# Patient Record
Sex: Male | Born: 1995 | Race: White | Hispanic: No | Marital: Married | Smoking: Never smoker
Health system: Southern US, Community
[De-identification: ages and names within clinical notes are randomized; demographics above are authoritative.]

---

## 2020-02-09 ENCOUNTER — Inpatient Hospital Stay (HOSPITAL_COMMUNITY): Payer: No Typology Code available for payment source

## 2020-02-09 ENCOUNTER — Emergency Department (HOSPITAL_COMMUNITY): Payer: No Typology Code available for payment source | Admitting: Certified Registered Nurse Anesthetist

## 2020-02-09 ENCOUNTER — Encounter (HOSPITAL_COMMUNITY): Payer: Self-pay | Admitting: Certified Registered Nurse Anesthetist

## 2020-02-09 ENCOUNTER — Inpatient Hospital Stay (HOSPITAL_COMMUNITY)
Admission: EM | Admit: 2020-02-09 | Discharge: 2020-02-21 | DRG: 464 | Disposition: A | Discharge status: Home / Self Care | Payer: No Typology Code available for payment source | Attending: Orthopedic Surgery | Admitting: Orthopedic Surgery

## 2020-02-09 ENCOUNTER — Emergency Department (HOSPITAL_COMMUNITY): Payer: No Typology Code available for payment source

## 2020-02-09 ENCOUNTER — Other Ambulatory Visit: Payer: Self-pay

## 2020-02-09 ENCOUNTER — Encounter (HOSPITAL_COMMUNITY)
Admission: EM | Disposition: A | Discharge status: Home / Self Care | Payer: Self-pay | Source: Home / Self Care | Attending: Orthopedic Surgery

## 2020-02-09 DIAGNOSIS — S91311A Laceration without foreign body, right foot, initial encounter: Secondary | ICD-10-CM | POA: Diagnosis present

## 2020-02-09 DIAGNOSIS — Z20822 Contact with and (suspected) exposure to covid-19: Secondary | ICD-10-CM | POA: Diagnosis present

## 2020-02-09 DIAGNOSIS — S82221C Displaced transverse fracture of shaft of right tibia, initial encounter for open fracture type IIIA, IIIB, or IIIC: Secondary | ICD-10-CM | POA: Diagnosis present

## 2020-02-09 DIAGNOSIS — R58 Hemorrhage, not elsewhere classified: Secondary | ICD-10-CM

## 2020-02-09 DIAGNOSIS — H53149 Visual discomfort, unspecified: Secondary | ICD-10-CM | POA: Diagnosis not present

## 2020-02-09 DIAGNOSIS — D62 Acute posthemorrhagic anemia: Secondary | ICD-10-CM | POA: Diagnosis present

## 2020-02-09 DIAGNOSIS — S82491C Other fracture of shaft of right fibula, initial encounter for open fracture type IIIA, IIIB, or IIIC: Secondary | ICD-10-CM | POA: Diagnosis present

## 2020-02-09 DIAGNOSIS — S92912A Unspecified fracture of left toe(s), initial encounter for closed fracture: Secondary | ICD-10-CM

## 2020-02-09 DIAGNOSIS — S80211A Abrasion, right knee, initial encounter: Secondary | ICD-10-CM | POA: Diagnosis present

## 2020-02-09 DIAGNOSIS — R52 Pain, unspecified: Secondary | ICD-10-CM

## 2020-02-09 DIAGNOSIS — E8889 Other specified metabolic disorders: Secondary | ICD-10-CM | POA: Diagnosis present

## 2020-02-09 DIAGNOSIS — H538 Other visual disturbances: Secondary | ICD-10-CM | POA: Diagnosis not present

## 2020-02-09 DIAGNOSIS — S82202B Unspecified fracture of shaft of left tibia, initial encounter for open fracture type I or II: Secondary | ICD-10-CM

## 2020-02-09 DIAGNOSIS — S92515A Nondisplaced fracture of proximal phalanx of left lesser toe(s), initial encounter for closed fracture: Secondary | ICD-10-CM | POA: Diagnosis present

## 2020-02-09 DIAGNOSIS — S91009A Unspecified open wound, unspecified ankle, initial encounter: Secondary | ICD-10-CM

## 2020-02-09 DIAGNOSIS — S0003XA Contusion of scalp, initial encounter: Secondary | ICD-10-CM | POA: Diagnosis present

## 2020-02-09 DIAGNOSIS — Y92410 Unspecified street and highway as the place of occurrence of the external cause: Secondary | ICD-10-CM | POA: Diagnosis not present

## 2020-02-09 DIAGNOSIS — S82402B Unspecified fracture of shaft of left fibula, initial encounter for open fracture type I or II: Secondary | ICD-10-CM

## 2020-02-09 DIAGNOSIS — T148XXA Other injury of unspecified body region, initial encounter: Secondary | ICD-10-CM

## 2020-02-09 DIAGNOSIS — S82209B Unspecified fracture of shaft of unspecified tibia, initial encounter for open fracture type I or II: Secondary | ICD-10-CM | POA: Diagnosis present

## 2020-02-09 DIAGNOSIS — Z419 Encounter for procedure for purposes other than remedying health state, unspecified: Secondary | ICD-10-CM

## 2020-02-09 DIAGNOSIS — S060X0A Concussion without loss of consciousness, initial encounter: Secondary | ICD-10-CM | POA: Diagnosis present

## 2020-02-09 DIAGNOSIS — S134XXA Sprain of ligaments of cervical spine, initial encounter: Secondary | ICD-10-CM | POA: Diagnosis present

## 2020-02-09 HISTORY — PX: I & D EXTREMITY: SHX5045

## 2020-02-09 HISTORY — DX: Displaced transverse fracture of shaft of right tibia, initial encounter for open fracture type IIIA, IIIB, or IIIC: S82.221C

## 2020-02-09 HISTORY — PX: EXTERNAL FIXATION LEG: SHX1549

## 2020-02-09 LAB — COMPREHENSIVE METABOLIC PANEL
ALT: 27 U/L (ref 0–44)
AST: 34 U/L (ref 15–41)
Albumin: 4.3 g/dL (ref 3.5–5.0)
Alkaline Phosphatase: 36 U/L — ABNORMAL LOW (ref 38–126)
Anion gap: 11 (ref 5–15)
BUN: 18 mg/dL (ref 6–20)
CO2: 24 mmol/L (ref 22–32)
Calcium: 9.1 mg/dL (ref 8.9–10.3)
Chloride: 104 mmol/L (ref 98–111)
Creatinine, Ser: 1.25 mg/dL — ABNORMAL HIGH (ref 0.61–1.24)
GFR calc Af Amer: >60 mL/min (ref >60)
GFR calc non Af Amer: >60 mL/min (ref >60)
Glucose, Bld: 125 mg/dL — ABNORMAL HIGH (ref 70–99)
Potassium: 3.6 mmol/L (ref 3.5–5.1)
Sodium: 139 mmol/L (ref 135–145)
Total Bilirubin: 0.8 mg/dL (ref 0.3–1.2)
Total Protein: 6.6 g/dL (ref 6.5–8.1)

## 2020-02-09 LAB — CBC
HCT: 42.2 % (ref 39.0–52.0)
Hemoglobin: 14.2 g/dL (ref 13.0–17.0)
MCH: 29.3 pg (ref 26.0–34.0)
MCHC: 33.6 g/dL (ref 30.0–36.0)
MCV: 87.2 fL (ref 80.0–100.0)
Platelets: 328 10*3/uL (ref 150–400)
RBC: 4.84 MIL/uL (ref 4.22–5.81)
RDW: 11.5 % (ref 11.5–15.5)
WBC: 16.3 10*3/uL — ABNORMAL HIGH (ref 4.0–10.5)
nRBC: 0 % (ref 0.0–0.2)

## 2020-02-09 LAB — I-STAT CHEM 8, ED
BUN: 19 mg/dL (ref 6–20)
Calcium, Ion: 1.14 mmol/L — ABNORMAL LOW (ref 1.15–1.40)
Chloride: 103 mmol/L (ref 98–111)
Creatinine, Ser: 1.3 mg/dL — ABNORMAL HIGH (ref 0.61–1.24)
Glucose, Bld: 120 mg/dL — ABNORMAL HIGH (ref 70–99)
HCT: 42 % (ref 39.0–52.0)
Hemoglobin: 14.3 g/dL (ref 13.0–17.0)
Potassium: 3.5 mmol/L (ref 3.5–5.1)
Sodium: 138 mmol/L (ref 135–145)
TCO2: 26 mmol/L (ref 22–32)

## 2020-02-09 LAB — HIV ANTIBODY (ROUTINE TESTING W REFLEX): HIV Screen 4th Generation wRfx: NONREACTIVE

## 2020-02-09 LAB — LACTIC ACID, PLASMA: Lactic Acid, Venous: 2.3 mmol/L (ref 0.5–1.9)

## 2020-02-09 LAB — PROTIME-INR
INR: 1.1 (ref 0.8–1.2)
Prothrombin Time: 14.1 seconds (ref 11.4–15.2)

## 2020-02-09 LAB — ETHANOL: Alcohol, Ethyl (B): <10 mg/dL (ref <10)

## 2020-02-09 LAB — RESPIRATORY PANEL BY RT PCR (FLU A&B, COVID)
Influenza A by PCR: NEGATIVE
Influenza B by PCR: NEGATIVE
SARS Coronavirus 2 by RT PCR: NEGATIVE

## 2020-02-09 LAB — SAMPLE TO BLOOD BANK

## 2020-02-09 SURGERY — EXTERNAL FIXATION, LOWER EXTREMITY
Anesthesia: General | Laterality: Right

## 2020-02-09 MED ORDER — DEXMEDETOMIDINE HCL 200 MCG/2ML IV SOLN
INTRAVENOUS | Status: DC | PRN
  Administered 2020-02-09 (×2): 8 ug via INTRAVENOUS
  Administered 2020-02-09: 12 ug via INTRAVENOUS

## 2020-02-09 MED ORDER — VANCOMYCIN HCL 1000 MG IV SOLR
INTRAVENOUS | Status: AC
  Filled 2020-02-09: qty 1000

## 2020-02-09 MED ORDER — OXYCODONE HCL 5 MG PO TABS
5.0000 mg | ORAL_TABLET | ORAL | Status: DC | PRN
  Administered 2020-02-12 (×3): 10 mg via ORAL
  Filled 2020-02-09 (×5): qty 2

## 2020-02-09 MED ORDER — LIDOCAINE 2% (20 MG/ML) 5 ML SYRINGE
INTRAMUSCULAR | Status: DC | PRN
  Administered 2020-02-09: 60 mg via INTRAVENOUS

## 2020-02-09 MED ORDER — MIDAZOLAM HCL 2 MG/2ML IJ SOLN
INTRAMUSCULAR | Status: AC
  Filled 2020-02-09: qty 2

## 2020-02-09 MED ORDER — ONDANSETRON HCL 4 MG/2ML IJ SOLN
INTRAMUSCULAR | Status: AC
  Filled 2020-02-09: qty 2

## 2020-02-09 MED ORDER — ONDANSETRON HCL 4 MG PO TABS: 4.0000 mg | ORAL_TABLET | Freq: Four times a day (QID) | ORAL | Status: DC | PRN

## 2020-02-09 MED ORDER — TOBRAMYCIN SULFATE 1.2 G IJ SOLR
INTRAMUSCULAR | Status: DC | PRN
  Administered 2020-02-09: 1.2 g

## 2020-02-09 MED ORDER — FENTANYL CITRATE (PF) 100 MCG/2ML IJ SOLN
INTRAMUSCULAR | Status: AC
  Filled 2020-02-09: qty 2

## 2020-02-09 MED ORDER — DOCUSATE SODIUM 100 MG PO CAPS
100.0000 mg | ORAL_CAPSULE | Freq: Two times a day (BID) | ORAL | Status: DC
  Administered 2020-02-09 – 2020-02-21 (×23): 100 mg via ORAL
  Filled 2020-02-09 (×23): qty 1

## 2020-02-09 MED ORDER — TOBRAMYCIN SULFATE 1.2 G IJ SOLR
INTRAMUSCULAR | Status: AC
  Filled 2020-02-09: qty 1.2

## 2020-02-09 MED ORDER — SODIUM CHLORIDE 0.9 % IV SOLN
2.0000 g | INTRAVENOUS | Status: AC
  Administered 2020-02-09 – 2020-02-11 (×3): 2 g via INTRAVENOUS
  Filled 2020-02-09 (×3): qty 20

## 2020-02-09 MED ORDER — MORPHINE SULFATE (PF) 2 MG/ML IV SOLN
2.0000 mg | INTRAVENOUS | Status: DC | PRN
  Administered 2020-02-10 – 2020-02-15 (×3): 2 mg via INTRAVENOUS
  Filled 2020-02-09 (×3): qty 1

## 2020-02-09 MED ORDER — DEXAMETHASONE SODIUM PHOSPHATE 10 MG/ML IJ SOLN
INTRAMUSCULAR | Status: DC | PRN
  Administered 2020-02-09: 4 mg via INTRAVENOUS

## 2020-02-09 MED ORDER — LIDOCAINE 2% (20 MG/ML) 5 ML SYRINGE
INTRAMUSCULAR | Status: AC
  Filled 2020-02-09: qty 5

## 2020-02-09 MED ORDER — ACETAMINOPHEN 325 MG PO TABS: 325.0000 mg | ORAL_TABLET | Freq: Four times a day (QID) | ORAL | Status: DC | PRN

## 2020-02-09 MED ORDER — OXYCODONE HCL 5 MG PO TABS
10.0000 mg | ORAL_TABLET | ORAL | Status: DC | PRN
  Administered 2020-02-10 – 2020-02-11 (×6): 15 mg via ORAL
  Administered 2020-02-12: 10 mg via ORAL
  Administered 2020-02-13 (×2): 15 mg via ORAL
  Administered 2020-02-13: 10 mg via ORAL
  Administered 2020-02-14 (×3): 15 mg via ORAL
  Filled 2020-02-09 (×2): qty 2
  Filled 2020-02-09 (×11): qty 3

## 2020-02-09 MED ORDER — ENOXAPARIN SODIUM 40 MG/0.4ML ~~LOC~~ SOLN
40.0000 mg | SUBCUTANEOUS | Status: DC
  Administered 2020-02-10 – 2020-02-21 (×10): 40 mg via SUBCUTANEOUS
  Filled 2020-02-09 (×11): qty 0.4

## 2020-02-09 MED ORDER — CEFAZOLIN SODIUM-DEXTROSE 1-4 GM/50ML-% IV SOLN
INTRAVENOUS | Status: DC | PRN
  Administered 2020-02-09: 1 g via INTRAVENOUS

## 2020-02-09 MED ORDER — POTASSIUM CHLORIDE IN NACL 20-0.9 MEQ/L-% IV SOLN
INTRAVENOUS | Status: DC
  Administered 2020-02-18: 100 mL/h via INTRAVENOUS
  Filled 2020-02-09 (×11): qty 1000

## 2020-02-09 MED ORDER — ACETAMINOPHEN 160 MG/5ML PO SOLN: 325.0000 mg | Freq: Once | ORAL | Status: DC | PRN

## 2020-02-09 MED ORDER — SODIUM CHLORIDE 0.9 % IV SOLN: INTRAVENOUS | Status: DC | PRN

## 2020-02-09 MED ORDER — ALBUMIN HUMAN 5 % IV SOLN: INTRAVENOUS | Status: DC | PRN

## 2020-02-09 MED ORDER — MEPERIDINE HCL 25 MG/ML IJ SOLN: 6.2500 mg | INTRAMUSCULAR | Status: DC | PRN

## 2020-02-09 MED ORDER — METOCLOPRAMIDE HCL 5 MG/ML IJ SOLN: 5.0000 mg | Freq: Three times a day (TID) | INTRAMUSCULAR | Status: DC | PRN

## 2020-02-09 MED ORDER — METHOCARBAMOL 1000 MG/10ML IJ SOLN
500.0000 mg | Freq: Four times a day (QID) | INTRAVENOUS | Status: DC | PRN
  Filled 2020-02-09: qty 5

## 2020-02-09 MED ORDER — MIDAZOLAM HCL 5 MG/5ML IJ SOLN
INTRAMUSCULAR | Status: DC | PRN
  Administered 2020-02-09: 2 mg via INTRAVENOUS

## 2020-02-09 MED ORDER — FENTANYL CITRATE (PF) 100 MCG/2ML IJ SOLN
50.0000 ug | Freq: Once | INTRAMUSCULAR | Status: AC
  Administered 2020-02-09: 15:00:00 50 ug via INTRAVENOUS

## 2020-02-09 MED ORDER — SODIUM CHLORIDE (PF) 0.9 % IJ SOLN
INTRAMUSCULAR | Status: AC
  Filled 2020-02-09: qty 10

## 2020-02-09 MED ORDER — ROCURONIUM BROMIDE 10 MG/ML (PF) SYRINGE
PREFILLED_SYRINGE | INTRAVENOUS | Status: DC | PRN
  Administered 2020-02-09 (×2): 20 mg via INTRAVENOUS
  Administered 2020-02-09: 60 mg via INTRAVENOUS

## 2020-02-09 MED ORDER — SUCCINYLCHOLINE CHLORIDE 200 MG/10ML IV SOSY
PREFILLED_SYRINGE | INTRAVENOUS | Status: DC | PRN
  Administered 2020-02-09: 180 mg via INTRAVENOUS

## 2020-02-09 MED ORDER — HYDROMORPHONE HCL 1 MG/ML IJ SOLN
INTRAMUSCULAR | Status: AC
  Filled 2020-02-09: qty 1

## 2020-02-09 MED ORDER — PHENYLEPHRINE 40 MCG/ML (10ML) SYRINGE FOR IV PUSH (FOR BLOOD PRESSURE SUPPORT)
PREFILLED_SYRINGE | INTRAVENOUS | Status: DC | PRN
  Administered 2020-02-09 (×3): 120 ug via INTRAVENOUS

## 2020-02-09 MED ORDER — PHENYLEPHRINE 40 MCG/ML (10ML) SYRINGE FOR IV PUSH (FOR BLOOD PRESSURE SUPPORT)
PREFILLED_SYRINGE | INTRAVENOUS | Status: AC
  Filled 2020-02-09: qty 30

## 2020-02-09 MED ORDER — METHOCARBAMOL 1000 MG/10ML IJ SOLN
500.0000 mg | Freq: Four times a day (QID) | INTRAVENOUS | Status: DC | PRN
  Filled 2020-02-09 (×4): qty 5

## 2020-02-09 MED ORDER — DEXAMETHASONE SODIUM PHOSPHATE 10 MG/ML IJ SOLN
INTRAMUSCULAR | Status: AC
  Filled 2020-02-09: qty 1

## 2020-02-09 MED ORDER — VANCOMYCIN HCL 1000 MG IV SOLR
INTRAVENOUS | Status: DC | PRN
  Administered 2020-02-09: 1000 mg

## 2020-02-09 MED ORDER — CEFAZOLIN SODIUM 1 G IJ SOLR
INTRAMUSCULAR | Status: AC
  Filled 2020-02-09: qty 10

## 2020-02-09 MED ORDER — LACTATED RINGERS IV SOLN: INTRAVENOUS | Status: DC

## 2020-02-09 MED ORDER — 0.9 % SODIUM CHLORIDE (POUR BTL) OPTIME
TOPICAL | Status: DC | PRN
  Administered 2020-02-09: 1000 mL

## 2020-02-09 MED ORDER — METHOCARBAMOL 500 MG PO TABS
ORAL_TABLET | ORAL | Status: AC
  Filled 2020-02-09: qty 1

## 2020-02-09 MED ORDER — FENTANYL CITRATE (PF) 100 MCG/2ML IJ SOLN: 50.0000 ug | INTRAMUSCULAR | Status: DC | PRN

## 2020-02-09 MED ORDER — FENTANYL CITRATE (PF) 250 MCG/5ML IJ SOLN
INTRAMUSCULAR | Status: DC | PRN
  Administered 2020-02-09: 50 ug via INTRAVENOUS
  Administered 2020-02-09: 100 ug via INTRAVENOUS
  Administered 2020-02-09 (×2): 50 ug via INTRAVENOUS
  Administered 2020-02-09: 100 ug via INTRAVENOUS
  Administered 2020-02-09: 50 ug via INTRAVENOUS

## 2020-02-09 MED ORDER — METOCLOPRAMIDE HCL 5 MG PO TABS: 5.0000 mg | ORAL_TABLET | Freq: Three times a day (TID) | ORAL | Status: DC | PRN

## 2020-02-09 MED ORDER — ROCURONIUM BROMIDE 10 MG/ML (PF) SYRINGE
PREFILLED_SYRINGE | INTRAVENOUS | Status: AC
  Filled 2020-02-09: qty 30

## 2020-02-09 MED ORDER — ROCURONIUM BROMIDE 10 MG/ML (PF) SYRINGE
PREFILLED_SYRINGE | INTRAVENOUS | Status: AC
  Filled 2020-02-09: qty 10

## 2020-02-09 MED ORDER — OXYCODONE HCL 5 MG PO TABS: 5.0000 mg | ORAL_TABLET | ORAL | Status: DC | PRN

## 2020-02-09 MED ORDER — METHOCARBAMOL 500 MG PO TABS
500.0000 mg | ORAL_TABLET | Freq: Four times a day (QID) | ORAL | Status: DC | PRN
  Administered 2020-02-09 – 2020-02-13 (×6): 500 mg via ORAL
  Filled 2020-02-09 (×5): qty 1

## 2020-02-09 MED ORDER — SUCCINYLCHOLINE CHLORIDE 200 MG/10ML IV SOSY
PREFILLED_SYRINGE | INTRAVENOUS | Status: AC
  Filled 2020-02-09: qty 10

## 2020-02-09 MED ORDER — PROMETHAZINE HCL 25 MG/ML IJ SOLN: 6.2500 mg | INTRAMUSCULAR | Status: DC | PRN

## 2020-02-09 MED ORDER — IOHEXOL 300 MG/ML  SOLN
100.0000 mL | Freq: Once | INTRAMUSCULAR | Status: AC | PRN
  Administered 2020-02-09: 100 mL via INTRAVENOUS

## 2020-02-09 MED ORDER — HYDROMORPHONE HCL 1 MG/ML IJ SOLN
1.0000 mg | INTRAMUSCULAR | Status: DC | PRN
  Administered 2020-02-10 – 2020-02-14 (×7): 1 mg via INTRAVENOUS
  Filled 2020-02-09 (×8): qty 1

## 2020-02-09 MED ORDER — ACETAMINOPHEN 500 MG PO TABS
1000.0000 mg | ORAL_TABLET | Freq: Four times a day (QID) | ORAL | Status: DC
  Administered 2020-02-10 – 2020-02-14 (×16): 1000 mg via ORAL
  Filled 2020-02-09 (×18): qty 2

## 2020-02-09 MED ORDER — METHOCARBAMOL 500 MG PO TABS: 500.0000 mg | ORAL_TABLET | Freq: Four times a day (QID) | ORAL | Status: DC | PRN

## 2020-02-09 MED ORDER — GABAPENTIN 100 MG PO CAPS
100.0000 mg | ORAL_CAPSULE | Freq: Three times a day (TID) | ORAL | Status: DC
  Administered 2020-02-09 – 2020-02-15 (×16): 100 mg via ORAL
  Filled 2020-02-09 (×16): qty 1

## 2020-02-09 MED ORDER — ACETAMINOPHEN 10 MG/ML IV SOLN: 1000.0000 mg | Freq: Once | INTRAVENOUS | Status: DC | PRN

## 2020-02-09 MED ORDER — PROPOFOL 10 MG/ML IV BOLUS
INTRAVENOUS | Status: DC | PRN
  Administered 2020-02-09: 180 mg via INTRAVENOUS

## 2020-02-09 MED ORDER — FENTANYL CITRATE (PF) 250 MCG/5ML IJ SOLN
INTRAMUSCULAR | Status: AC
  Filled 2020-02-09: qty 5

## 2020-02-09 MED ORDER — TETANUS-DIPHTH-ACELL PERTUSSIS 5-2.5-18.5 LF-MCG/0.5 IM SUSP
0.5000 mL | Freq: Once | INTRAMUSCULAR | Status: AC
  Administered 2020-02-09: 14:00:00 0.5 mL via INTRAMUSCULAR

## 2020-02-09 MED ORDER — HYDROMORPHONE HCL 1 MG/ML IJ SOLN
0.2500 mg | INTRAMUSCULAR | Status: DC | PRN
  Administered 2020-02-09 (×3): 0.5 mg via INTRAVENOUS

## 2020-02-09 MED ORDER — PROPOFOL 10 MG/ML IV BOLUS
INTRAVENOUS | Status: AC
  Filled 2020-02-09: qty 20

## 2020-02-09 MED ORDER — EPHEDRINE 5 MG/ML INJ
INTRAVENOUS | Status: AC
  Filled 2020-02-09: qty 10

## 2020-02-09 MED ORDER — POLYETHYLENE GLYCOL 3350 17 G PO PACK
17.0000 g | PACK | Freq: Every day | ORAL | Status: DC | PRN
  Administered 2020-02-12 – 2020-02-15 (×2): 17 g via ORAL
  Filled 2020-02-09 (×2): qty 1

## 2020-02-09 MED ORDER — ONDANSETRON HCL 4 MG/2ML IJ SOLN: 4.0000 mg | Freq: Four times a day (QID) | INTRAMUSCULAR | Status: DC | PRN

## 2020-02-09 MED ORDER — LACTATED RINGERS IV SOLN: INTRAVENOUS | Status: DC | PRN

## 2020-02-09 MED ORDER — ONDANSETRON HCL 4 MG/2ML IJ SOLN
4.0000 mg | Freq: Four times a day (QID) | INTRAMUSCULAR | Status: DC | PRN
  Administered 2020-02-10 – 2020-02-17 (×4): 4 mg via INTRAVENOUS
  Filled 2020-02-09 (×4): qty 2

## 2020-02-09 MED ORDER — ACETAMINOPHEN 325 MG PO TABS: 325.0000 mg | ORAL_TABLET | Freq: Once | ORAL | Status: DC | PRN

## 2020-02-09 MED ORDER — ONDANSETRON HCL 4 MG/2ML IJ SOLN
INTRAMUSCULAR | Status: DC | PRN
  Administered 2020-02-09: 4 mg via INTRAVENOUS

## 2020-02-09 MED ORDER — SUGAMMADEX SODIUM 200 MG/2ML IV SOLN
INTRAVENOUS | Status: DC | PRN
  Administered 2020-02-09: 50 mg via INTRAVENOUS
  Administered 2020-02-09 (×2): 100 mg via INTRAVENOUS

## 2020-02-09 SURGICAL SUPPLY — 58 items
BAR GLASS FIBER EXFX 11X150 (EXFIX) ×3 IMPLANT
BAR GLASS FIBER EXFX 11X200 (EXFIX) ×3 IMPLANT
BAR GLASS FIBER EXFX 11X400 (EXFIX) ×3 IMPLANT
BAR GLASS FIBER EXFX 11X500 (EXFIX) ×3 IMPLANT
BIT DRILL 3.2 XTRAFIX BLUE (BIT) ×3 IMPLANT
BIT DRILL CANN MED FLUTE 4.0 (BIT) ×1 IMPLANT
BNDG ELASTIC 4X5.8 VLCR STR LF (GAUZE/BANDAGES/DRESSINGS) ×6 IMPLANT
BNDG ELASTIC 6X15 VLCR STRL LF (GAUZE/BANDAGES/DRESSINGS) ×3 IMPLANT
BNDG ELASTIC 6X5.8 VLCR STR LF (GAUZE/BANDAGES/DRESSINGS) ×3 IMPLANT
BRUSH SCRUB EZ PLAIN DRY (MISCELLANEOUS) ×6 IMPLANT
CHLORAPREP W/TINT 26 (MISCELLANEOUS) ×6 IMPLANT
CLAMP BLUE BAR TO BAR (EXFIX) ×6 IMPLANT
CLAMP BLUE BAR TO PIN (EXFIX) ×12 IMPLANT
CONNECTOR Y ATS VAC SYSTEM (MISCELLANEOUS) ×3 IMPLANT
COVER MAYO STAND STRL (DRAPES) ×3 IMPLANT
COVER PROBE W GEL 5X96 (DRAPES) ×3 IMPLANT
COVER SURGICAL LIGHT HANDLE (MISCELLANEOUS) ×3 IMPLANT
DRAPE C-ARM 42X72 X-RAY (DRAPES) ×3 IMPLANT
DRAPE C-ARMOR (DRAPES) ×3 IMPLANT
DRAPE IMP U-DRAPE 54X76 (DRAPES) ×3 IMPLANT
DRAPE ORTHO SPLIT 77X108 STRL (DRAPES) ×4
DRAPE SURG ORHT 6 SPLT 77X108 (DRAPES) ×2 IMPLANT
DRAPE U-SHAPE 47X51 STRL (DRAPES) ×3 IMPLANT
DRILL CANN 4.0MM (BIT) ×3
DRSG MEPITEL 4X7.2 (GAUZE/BANDAGES/DRESSINGS) ×6 IMPLANT
ELECT REM PT RETURN 9FT ADLT (ELECTROSURGICAL) ×3
ELECTRODE REM PT RTRN 9FT ADLT (ELECTROSURGICAL) ×1 IMPLANT
GAUZE SPONGE 4X4 12PLY STRL (GAUZE/BANDAGES/DRESSINGS) ×3 IMPLANT
GLOVE BIO SURGEON STRL SZ 6.5 (GLOVE) ×6 IMPLANT
GLOVE BIO SURGEON STRL SZ7.5 (GLOVE) ×12 IMPLANT
GLOVE BIO SURGEONS STRL SZ 6.5 (GLOVE) ×3
GLOVE BIOGEL PI IND STRL 6.5 (GLOVE) ×3 IMPLANT
GLOVE BIOGEL PI IND STRL 7.5 (GLOVE) ×3 IMPLANT
GLOVE BIOGEL PI INDICATOR 6.5 (GLOVE) ×6
GLOVE BIOGEL PI INDICATOR 7.5 (GLOVE) ×6
GOWN STRL REUS W/ TWL LRG LVL3 (GOWN DISPOSABLE) ×2 IMPLANT
GOWN STRL REUS W/TWL LRG LVL3 (GOWN DISPOSABLE) ×4
HALF PIN 4MM (EXFIX) ×6 IMPLANT
HANDPIECE INTERPULSE COAX TIP (DISPOSABLE) ×2
KIT BASIN OR (CUSTOM PROCEDURE TRAY) ×3 IMPLANT
KIT TURNOVER KIT B (KITS) ×3 IMPLANT
MANIFOLD NEPTUNE II (INSTRUMENTS) ×3 IMPLANT
NS IRRIG 1000ML POUR BTL (IV SOLUTION) ×3 IMPLANT
PACK ORTHO EXTREMITY (CUSTOM PROCEDURE TRAY) ×3 IMPLANT
PAD ARMBOARD 7.5X6 YLW CONV (MISCELLANEOUS) ×6 IMPLANT
PAD NEG PRESSURE SENSATRAC (MISCELLANEOUS) ×3 IMPLANT
PIN BLUNT XTRFIX LG 5X160X55MM (EXFIX) ×6 IMPLANT
PIN CLAMP 2BAR 75MM BLUE (EXFIX) ×3 IMPLANT
PIN TRANSFIXING 5.0 (EXFIX) ×3 IMPLANT
SET HNDPC FAN SPRY TIP SCT (DISPOSABLE) ×1 IMPLANT
SPONGE LAP 18X18 RF (DISPOSABLE) ×3 IMPLANT
TOWEL GREEN STERILE (TOWEL DISPOSABLE) ×6 IMPLANT
TOWEL GREEN STERILE FF (TOWEL DISPOSABLE) ×6 IMPLANT
TUBE CONNECTING 12'X1/4 (SUCTIONS) ×1
TUBE CONNECTING 12X1/4 (SUCTIONS) ×2 IMPLANT
UNDERPAD 30X30 (UNDERPADS AND DIAPERS) ×3 IMPLANT
WATER STERILE IRR 1000ML POUR (IV SOLUTION) ×3 IMPLANT
YANKAUER SUCT BULB TIP NO VENT (SUCTIONS) ×3 IMPLANT
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
## 2020-02-09 NOTE — H&P (Signed)
Please see Ortho consult note from same day for full H&P.
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
## 2020-02-09 NOTE — Transfer of Care (Signed)
Immediate Anesthesia Transfer of Care Note  Patient: Jose Wagner  Procedure(s) Performed: EXTERNAL FIXATION LEG (Right ) IRRIGATION AND DEBRIDEMENT EXTREMITY (Right )  Patient Location: PACU  Anesthesia Type:General  Level of Consciousness: awake, alert  and oriented  Airway & Oxygen Therapy: Patient Spontanous Breathing and Patient connected to nasal cannula oxygen  Post-op Assessment: Report given to RN and Post -op Vital signs reviewed and stable  Post vital signs: Reviewed and stable  Last Vitals:  Vitals Value Taken Time  BP 112/77 02/09/20 1816  Temp    Pulse 92 02/09/20 1817  Resp 0 02/09/20 1817  SpO2 99 % 02/09/20 1817  Vitals shown include unvalidated device data.  Last Pain:  Vitals:   02/09/20 1419  TempSrc:   PainSc: 8          Complications: No apparent anesthesia complications

## 2020-02-09 NOTE — Anesthesia Procedure Notes (Signed)
Procedure Name: Intubation Date/Time: 02/09/2020 3:51 PM Performed by: Waynard Edwards, CRNA Pre-anesthesia Checklist: Patient identified, Emergency Drugs available, Suction available and Patient being monitored Patient Re-evaluated:Patient Re-evaluated prior to induction Oxygen Delivery Method: Circle system utilized Preoxygenation: Pre-oxygenation with 100% oxygen Induction Type: IV induction, Rapid sequence and Cricoid Pressure applied Laryngoscope Size: Miller and 2 Grade View: Grade I Tube type: Oral Tube size: 7.5 mm Number of attempts: 1 Airway Equipment and Method: Stylet Placement Confirmation: ETT inserted through vocal cords under direct vision,  positive ETCO2 and breath sounds checked- equal and bilateral Secured at: 23 cm Tube secured with: Tape Dental Injury: Teeth and Oropharynx as per pre-operative assessment

## 2020-02-09 NOTE — Progress Notes (Signed)
Orthopedic Tech Progress Note Patient Details:  Jose Wagner 1996/04/18 [id number]  Ortho Devices Type of Ortho Device: Ace wrap, Stirrup splint, Post (short leg) splint Ortho Device/Splint Interventions: Application   Post Interventions Patient Tolerated: Well Instructions Provided: Care of device   Saul Fordyce 02/09/2020, 1:57 PM

## 2020-02-09 NOTE — Anesthesia Preprocedure Evaluation (Addendum)
Anesthesia Evaluation  Patient identified by MRN, date of birth, ID band Patient awake    Reviewed: Allergy & Precautions, NPO status , Patient's Chart, lab work & pertinent test results  Airway Mallampati: I  TM Distance: >3 FB Neck ROM: Full    Dental  (+) Teeth Intact, Dental Advisory Given   Pulmonary neg pulmonary ROS,    breath sounds clear to auscultation       Cardiovascular negative cardio ROS   Rhythm:Regular Rate:Normal     Neuro/Psych negative neurological ROS  negative psych ROS   GI/Hepatic negative GI ROS, Neg liver ROS,   Endo/Other  negative endocrine ROS  Renal/GU negative Renal ROS     Musculoskeletal   Abdominal Normal abdominal exam  (+)   Peds  Hematology negative hematology ROS (+)   Anesthesia Other Findings   Reproductive/Obstetrics                            Anesthesia Physical Anesthesia Plan  ASA: I and emergent  Anesthesia Plan: General   Post-op Pain Management:    Induction: Intravenous, Rapid sequence and Cricoid pressure planned  PONV Risk Score and Plan: 3 and Ondansetron, Dexamethasone and Midazolam  Airway Management Planned: Oral ETT  Additional Equipment: None  Intra-op Plan:   Post-operative Plan: Extubation in OR  Informed Consent: I have reviewed the patients History and Physical, chart, labs and discussed the procedure including the risks, benefits and alternatives for the proposed anesthesia with the patient or authorized representative who has indicated his/her understanding and acceptance.     Dental advisory given  Plan Discussed with: CRNA  Anesthesia Plan Comments:        Anesthesia Quick Evaluation
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
## 2020-02-09 NOTE — Anesthesia Postprocedure Evaluation (Signed)
Anesthesia Post Note  Patient: Jose Wagner  Procedure(s) Performed: EXTERNAL FIXATION LEG (Right ) IRRIGATION AND DEBRIDEMENT EXTREMITY (Right )     Patient location during evaluation: PACU Anesthesia Type: General Level of consciousness: awake Pain management: pain level controlled Vital Signs Assessment: post-procedure vital signs reviewed and stable Respiratory status: spontaneous breathing, nonlabored ventilation, respiratory function stable and patient connected to nasal cannula oxygen Cardiovascular status: blood pressure returned to baseline and stable Postop Assessment: no apparent nausea or vomiting Anesthetic complications: no    Last Vitals:  Vitals:   02/09/20 2030 02/09/20 2100  BP: 132/77 132/78  Pulse: 88 (!) 103  Resp: 17 18  Temp:    SpO2: 97% 99%    Last Pain:  Vitals:   02/09/20 2015  TempSrc:   PainSc: Asleep                 [name] Mcgann
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
## 2020-02-10 ENCOUNTER — Encounter (HOSPITAL_COMMUNITY): Payer: Self-pay | Admitting: Student

## 2020-02-10 ENCOUNTER — Other Ambulatory Visit: Payer: Self-pay

## 2020-02-10 LAB — CBC
HCT: 29.3 % — ABNORMAL LOW (ref 39.0–52.0)
Hemoglobin: 10.1 g/dL — ABNORMAL LOW (ref 13.0–17.0)
MCH: 29.6 pg (ref 26.0–34.0)
MCHC: 34.5 g/dL (ref 30.0–36.0)
MCV: 85.9 fL (ref 80.0–100.0)
Platelets: 268 10*3/uL (ref 150–400)
RBC: 3.41 MIL/uL — ABNORMAL LOW (ref 4.22–5.81)
RDW: 11.6 % (ref 11.5–15.5)
WBC: 14 10*3/uL — ABNORMAL HIGH (ref 4.0–10.5)
nRBC: 0 % (ref 0.0–0.2)

## 2020-02-10 LAB — BASIC METABOLIC PANEL
Anion gap: 11 (ref 5–15)
BUN: 14 mg/dL (ref 6–20)
CO2: 23 mmol/L (ref 22–32)
Calcium: 8.2 mg/dL — ABNORMAL LOW (ref 8.9–10.3)
Chloride: 100 mmol/L (ref 98–111)
Creatinine, Ser: 1.22 mg/dL (ref 0.61–1.24)
GFR calc Af Amer: >60 mL/min (ref >60)
GFR calc non Af Amer: >60 mL/min (ref >60)
Glucose, Bld: 162 mg/dL — ABNORMAL HIGH (ref 70–99)
Potassium: 4.3 mmol/L (ref 3.5–5.1)
Sodium: 134 mmol/L — ABNORMAL LOW (ref 135–145)

## 2020-02-10 LAB — VITAMIN D 25 HYDROXY (VIT D DEFICIENCY, FRACTURES): Vit D, 25-Hydroxy: 35.77 ng/mL (ref 30–100)

## 2020-02-10 MED ORDER — KETOROLAC TROMETHAMINE 15 MG/ML IJ SOLN
15.0000 mg | Freq: Four times a day (QID) | INTRAMUSCULAR | Status: AC
  Administered 2020-02-10 – 2020-02-11 (×5): 15 mg via INTRAVENOUS
  Filled 2020-02-10 (×5): qty 1

## 2020-02-10 NOTE — Progress Notes (Addendum)
Pt has multiple abrasions (bilarteral elbows/toes/feet/hands/middle fingers, head, L knee. All abrasions cleansed. Gauze applied to both middle fingers, mepilex to the L knee.   15mg  oxy given @0541 , no relief. Dilaudid given @0641 , little relief, pt still having pain within the RLE.   Pt able to wiggle toes, but still has decreased sensation prior to shift change.
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
## 2020-02-10 NOTE — Progress Notes (Signed)
Rehab Admissions Coordinator Note:  Patient was screened by Clois Dupes for appropriateness for an Inpatient Acute Rehab Consult per PT and OT recs.   At this time, we are recommending Inpatient Rehab consult. Please place order if you would like patient considered for CIR admit.  Clois Dupes RN MSN 02/10/2020, 12:45 PM  I can be reached at [phone].

## 2020-02-10 NOTE — Plan of Care (Signed)
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
## 2020-02-11 ENCOUNTER — Inpatient Hospital Stay (HOSPITAL_COMMUNITY): Payer: No Typology Code available for payment source

## 2020-02-11 LAB — BASIC METABOLIC PANEL
Anion gap: 10 (ref 5–15)
BUN: 12 mg/dL (ref 6–20)
CO2: 25 mmol/L (ref 22–32)
Calcium: 7.8 mg/dL — ABNORMAL LOW (ref 8.9–10.3)
Chloride: 102 mmol/L (ref 98–111)
Creatinine, Ser: 1.33 mg/dL — ABNORMAL HIGH (ref 0.61–1.24)
GFR calc Af Amer: >60 mL/min (ref >60)
GFR calc non Af Amer: >60 mL/min (ref >60)
Glucose, Bld: 121 mg/dL — ABNORMAL HIGH (ref 70–99)
Potassium: 4.1 mmol/L (ref 3.5–5.1)
Sodium: 137 mmol/L (ref 135–145)

## 2020-02-11 LAB — CBC
HCT: 22.4 % — ABNORMAL LOW (ref 39.0–52.0)
Hemoglobin: 7.6 g/dL — ABNORMAL LOW (ref 13.0–17.0)
MCH: 29.5 pg (ref 26.0–34.0)
MCHC: 33.9 g/dL (ref 30.0–36.0)
MCV: 86.8 fL (ref 80.0–100.0)
Platelets: 175 10*3/uL (ref 150–400)
RBC: 2.58 MIL/uL — ABNORMAL LOW (ref 4.22–5.81)
RDW: 11.7 % (ref 11.5–15.5)
WBC: 9.1 10*3/uL (ref 4.0–10.5)
nRBC: 0 % (ref 0.0–0.2)
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
## 2020-02-11 NOTE — Significant Event (Signed)
Rapid Response Event Note  Overview: Time Called: 1040 Arrival Time: 1045 Event Type: Neurologic New onset headache and blurred vision.  VS:  BP 130/65, HR 82, RR 14, SpO2 100% on room air Initial Focused Assessment: Pt alert, oriented lying in bed. Pt complains of pulsating headache at the front and back of his head. Pt tells me his headache feels better, but does not rate his headache pain. Pt states his vision is blurred. He is able to read the sign across the room, but as he focuses on it the words become blurry. Pt is able to appropriately move extremities, right leg not assessed due to external fixator in place. Pt endorses equal sensation. EOMI, pupils are equal and round. Face is symmetrical. Speech is clear.   Interventions: -No interventions from RR RN  -Orders received from E. Simaan, PA-C for STAT CT head   Plan of Care (if not transferred): -Treat pain, reevaluate effectiveness following treatment -Monitor pt for neurologic changes  Call rapid response for further needs  Event Summary: Name of Physician Notified: Jasmine December, PA-C at 1045(Notified by RN prior to my arrival. PA at bedside.) Outcome: Stayed in room and stabalized Event End Time: 1100  Jennye Moccasin

## 2020-02-11 NOTE — Consult Note (Signed)
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 today; headache, dizziness, nausea all consistent with concussion/TBI but will rule out interval development of intra-cranial bleed. Blurry vision - opthalmology to see, ok to dilate eyes if head CT negative  We will follow  Jill Alexanders, [facility] Surgery 02/11/2020, 11:52 AM Pager: [phone] Consults: [phone]

## 2020-02-11 NOTE — Progress Notes (Signed)
PT Cancellation Note  Patient Details Name: Jose Wagner MRN: [id number] DOB: 01/26/1996   Cancelled Treatment:    Reason Eval/Treat Not Completed: (P) Pain limiting ability to participate;Medical issues which prohibited therapy(Pt presents with new HA and blurred vision at this time.  RN reports to hold therapy.  Will defer tx today.)   Lillian Tigges Artis Delay 02/11/2020, 10:34 AM  Bonney Leitz , PTA Acute Rehabilitation Services Pager [phone] Office [phone]

## 2020-02-12 LAB — BASIC METABOLIC PANEL
Anion gap: 7 (ref 5–15)
BUN: 7 mg/dL (ref 6–20)
CO2: 28 mmol/L (ref 22–32)
Calcium: 8.3 mg/dL — ABNORMAL LOW (ref 8.9–10.3)
Chloride: 102 mmol/L (ref 98–111)
Creatinine, Ser: 1.19 mg/dL (ref 0.61–1.24)
GFR calc Af Amer: >60 mL/min (ref >60)
GFR calc non Af Amer: >60 mL/min (ref >60)
Glucose, Bld: 121 mg/dL — ABNORMAL HIGH (ref 70–99)
Potassium: 4 mmol/L (ref 3.5–5.1)
Sodium: 137 mmol/L (ref 135–145)

## 2020-02-12 LAB — CBC
HCT: 21 % — ABNORMAL LOW (ref 39.0–52.0)
Hemoglobin: 7.1 g/dL — ABNORMAL LOW (ref 13.0–17.0)
MCH: 29.2 pg (ref 26.0–34.0)
MCHC: 33.8 g/dL (ref 30.0–36.0)
MCV: 86.4 fL (ref 80.0–100.0)
Platelets: 185 10*3/uL (ref 150–400)
RBC: 2.43 MIL/uL — ABNORMAL LOW (ref 4.22–5.81)
RDW: 11.7 % (ref 11.5–15.5)
WBC: 9.4 10*3/uL (ref 4.0–10.5)
nRBC: 0 % (ref 0.0–0.2)

## 2020-02-12 LAB — ABO/RH: ABO/RH(D): A NEG

## 2020-02-12 LAB — PREPARE RBC (CROSSMATCH)

## 2020-02-12 MED ORDER — CEFAZOLIN SODIUM-DEXTROSE 2-4 GM/100ML-% IV SOLN
2.0000 g | INTRAVENOUS | Status: AC
  Administered 2020-02-13: 2 g via INTRAVENOUS
  Filled 2020-02-12: qty 100

## 2020-02-12 MED ORDER — SODIUM CHLORIDE 0.9% IV SOLUTION: Freq: Once | INTRAVENOUS | Status: AC
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
## 2020-02-13 ENCOUNTER — Inpatient Hospital Stay (HOSPITAL_COMMUNITY): Payer: No Typology Code available for payment source

## 2020-02-13 ENCOUNTER — Inpatient Hospital Stay (HOSPITAL_COMMUNITY): Payer: No Typology Code available for payment source | Admitting: Anesthesiology

## 2020-02-13 ENCOUNTER — Encounter (HOSPITAL_COMMUNITY): Payer: Self-pay | Admitting: Student

## 2020-02-13 ENCOUNTER — Encounter (HOSPITAL_COMMUNITY)
Admission: EM | Disposition: A | Discharge status: Home / Self Care | Payer: Self-pay | Source: Home / Self Care | Attending: Orthopedic Surgery

## 2020-02-13 HISTORY — PX: TIBIA IM NAIL INSERTION: SHX2516

## 2020-02-13 LAB — BPAM RBC
Blood Product Expiration Date: 202104282359
ISSUE DATE / TIME: 202104111114
Unit Type and Rh: 600

## 2020-02-13 LAB — TYPE AND SCREEN
ABO/RH(D): A NEG
Antibody Screen: NEGATIVE
Unit division: 0

## 2020-02-13 LAB — HEMOGLOBIN AND HEMATOCRIT, BLOOD
HCT: 25 % — ABNORMAL LOW (ref 39.0–52.0)
Hemoglobin: 8.6 g/dL — ABNORMAL LOW (ref 13.0–17.0)

## 2020-02-13 LAB — CBC
HCT: 24.2 % — ABNORMAL LOW (ref 39.0–52.0)
Hemoglobin: 8.3 g/dL — ABNORMAL LOW (ref 13.0–17.0)
MCH: 29.4 pg (ref 26.0–34.0)
MCHC: 34.3 g/dL (ref 30.0–36.0)
MCV: 85.8 fL (ref 80.0–100.0)
Platelets: 237 10*3/uL (ref 150–400)
RBC: 2.82 MIL/uL — ABNORMAL LOW (ref 4.22–5.81)
RDW: 12.3 % (ref 11.5–15.5)
WBC: 8.9 10*3/uL (ref 4.0–10.5)
nRBC: 0 % (ref 0.0–0.2)

## 2020-02-13 SURGERY — INSERTION, INTRAMEDULLARY ROD, TIBIA
Anesthesia: General | Laterality: Right

## 2020-02-13 MED ORDER — ACETAMINOPHEN 10 MG/ML IV SOLN: 1000.0000 mg | Freq: Once | INTRAVENOUS | Status: DC | PRN

## 2020-02-13 MED ORDER — SODIUM CHLORIDE 0.9 % IR SOLN
Status: DC | PRN
  Administered 2020-02-13: 3000 mL

## 2020-02-13 MED ORDER — HYDROMORPHONE HCL 1 MG/ML IJ SOLN
0.2500 mg | INTRAMUSCULAR | Status: DC | PRN
  Administered 2020-02-13 (×2): 0.5 mg via INTRAVENOUS

## 2020-02-13 MED ORDER — METHOCARBAMOL 1000 MG/10ML IJ SOLN
1000.0000 mg | Freq: Three times a day (TID) | INTRAVENOUS | Status: DC
  Administered 2020-02-13 – 2020-02-16 (×6): 1000 mg via INTRAVENOUS
  Filled 2020-02-13 (×11): qty 10

## 2020-02-13 MED ORDER — HYDROMORPHONE HCL 1 MG/ML IJ SOLN
INTRAMUSCULAR | Status: DC | PRN
  Administered 2020-02-13: 1 mg via INTRAVENOUS

## 2020-02-13 MED ORDER — MIDAZOLAM HCL 5 MG/5ML IJ SOLN
INTRAMUSCULAR | Status: DC | PRN
  Administered 2020-02-13: 2 mg via INTRAVENOUS

## 2020-02-13 MED ORDER — SUGAMMADEX SODIUM 200 MG/2ML IV SOLN
INTRAVENOUS | Status: DC | PRN
  Administered 2020-02-13: 200 mg via INTRAVENOUS

## 2020-02-13 MED ORDER — LIDOCAINE 2% (20 MG/ML) 5 ML SYRINGE
INTRAMUSCULAR | Status: DC | PRN
  Administered 2020-02-13: 100 mg via INTRAVENOUS

## 2020-02-13 MED ORDER — HYDROMORPHONE HCL 1 MG/ML IJ SOLN
INTRAMUSCULAR | Status: AC
  Administered 2020-02-13: 0.5 mg via INTRAVENOUS
  Filled 2020-02-13: qty 2

## 2020-02-13 MED ORDER — ONDANSETRON HCL 4 MG/2ML IJ SOLN: 4.0000 mg | Freq: Once | INTRAMUSCULAR | Status: DC | PRN

## 2020-02-13 MED ORDER — FENTANYL CITRATE (PF) 100 MCG/2ML IJ SOLN
INTRAMUSCULAR | Status: AC
  Administered 2020-02-13: 13:00:00 50 ug via INTRAVENOUS
  Filled 2020-02-13: qty 2

## 2020-02-13 MED ORDER — PROPOFOL 10 MG/ML IV BOLUS
INTRAVENOUS | Status: DC | PRN
  Administered 2020-02-13: 200 mg via INTRAVENOUS

## 2020-02-13 MED ORDER — ROCURONIUM BROMIDE 10 MG/ML (PF) SYRINGE
PREFILLED_SYRINGE | INTRAVENOUS | Status: AC
  Filled 2020-02-13: qty 30

## 2020-02-13 MED ORDER — ONDANSETRON HCL 4 MG/2ML IJ SOLN
INTRAMUSCULAR | Status: DC | PRN
  Administered 2020-02-13: 4 mg via INTRAVENOUS

## 2020-02-13 MED ORDER — FENTANYL CITRATE (PF) 250 MCG/5ML IJ SOLN
INTRAMUSCULAR | Status: AC
  Filled 2020-02-13: qty 5

## 2020-02-13 MED ORDER — ROCURONIUM BROMIDE 10 MG/ML (PF) SYRINGE
PREFILLED_SYRINGE | INTRAVENOUS | Status: DC | PRN
  Administered 2020-02-13: 70 mg via INTRAVENOUS
  Administered 2020-02-13: 30 mg via INTRAVENOUS
  Administered 2020-02-13: 20 mg via INTRAVENOUS
  Administered 2020-02-13: 30 mg via INTRAVENOUS

## 2020-02-13 MED ORDER — MIDAZOLAM HCL 2 MG/2ML IJ SOLN
INTRAMUSCULAR | Status: AC
  Filled 2020-02-13: qty 2

## 2020-02-13 MED ORDER — SODIUM CHLORIDE 0.9 % IV SOLN
2.0000 g | INTRAVENOUS | Status: AC
  Administered 2020-02-13 – 2020-02-15 (×3): 2 g via INTRAVENOUS
  Filled 2020-02-13 (×3): qty 20

## 2020-02-13 MED ORDER — DEXAMETHASONE SODIUM PHOSPHATE 10 MG/ML IJ SOLN
INTRAMUSCULAR | Status: DC | PRN
  Administered 2020-02-13: 10 mg via INTRAVENOUS

## 2020-02-13 MED ORDER — FENTANYL CITRATE (PF) 100 MCG/2ML IJ SOLN: 50.0000 ug | Freq: Once | INTRAMUSCULAR | Status: AC

## 2020-02-13 MED ORDER — FENTANYL CITRATE (PF) 250 MCG/5ML IJ SOLN
INTRAMUSCULAR | Status: DC | PRN
  Administered 2020-02-13: 50 ug via INTRAVENOUS
  Administered 2020-02-13: 100 ug via INTRAVENOUS
  Administered 2020-02-13 (×2): 50 ug via INTRAVENOUS
  Administered 2020-02-13: 100 ug via INTRAVENOUS

## 2020-02-13 MED ORDER — PHENYLEPHRINE HCL (PRESSORS) 10 MG/ML IV SOLN
INTRAVENOUS | Status: AC
  Filled 2020-02-13: qty 1

## 2020-02-13 MED ORDER — LIDOCAINE 2% (20 MG/ML) 5 ML SYRINGE
INTRAMUSCULAR | Status: AC
  Filled 2020-02-13: qty 10

## 2020-02-13 MED ORDER — DEXMEDETOMIDINE HCL IN NACL 400 MCG/100ML IV SOLN
INTRAVENOUS | Status: DC | PRN
  Administered 2020-02-13: 8 ug via INTRAVENOUS
  Administered 2020-02-13 (×2): 4 ug via INTRAVENOUS
  Administered 2020-02-13: 8 ug via INTRAVENOUS
  Administered 2020-02-13: 4 ug via INTRAVENOUS

## 2020-02-13 MED ORDER — PROPOFOL 10 MG/ML IV BOLUS
INTRAVENOUS | Status: AC
  Filled 2020-02-13: qty 20

## 2020-02-13 MED ORDER — HYDROMORPHONE HCL 1 MG/ML IJ SOLN
INTRAMUSCULAR | Status: AC
  Filled 2020-02-13: qty 1

## 2020-02-13 MED ORDER — LACTATED RINGERS IV SOLN: INTRAVENOUS | Status: DC

## 2020-02-13 MED ORDER — 0.9 % SODIUM CHLORIDE (POUR BTL) OPTIME
TOPICAL | Status: DC | PRN
  Administered 2020-02-13: 15:00:00 1000 mL

## 2020-02-13 MED ORDER — DEXAMETHASONE SODIUM PHOSPHATE 10 MG/ML IJ SOLN
INTRAMUSCULAR | Status: AC
  Filled 2020-02-13: qty 1

## 2020-02-13 SURGICAL SUPPLY — 72 items
BIT DRILL 3.8X6 NS (BIT) ×2 IMPLANT
BIT DRILL 4.4 NS (BIT) ×2 IMPLANT
BLADE SURG 10 STRL SS (BLADE) ×3 IMPLANT
BNDG ELASTIC 4X5.8 VLCR STR LF (GAUZE/BANDAGES/DRESSINGS) ×3 IMPLANT
BNDG ELASTIC 6X15 VLCR STRL LF (GAUZE/BANDAGES/DRESSINGS) ×2 IMPLANT
BNDG ELASTIC 6X5.8 VLCR STR LF (GAUZE/BANDAGES/DRESSINGS) ×3 IMPLANT
BNDG GAUZE ELAST 4 BULKY (GAUZE/BANDAGES/DRESSINGS) ×4 IMPLANT
BRUSH SCRUB EZ PLAIN DRY (MISCELLANEOUS) ×6 IMPLANT
CANISTER WOUND CARE 500ML ATS (WOUND CARE) ×2 IMPLANT
CONNECTOR Y ATS VAC SYSTEM (MISCELLANEOUS) ×2 IMPLANT
COTTON STERILE ROLL (GAUZE/BANDAGES/DRESSINGS) ×2 IMPLANT
COVER SURGICAL LIGHT HANDLE (MISCELLANEOUS) ×6 IMPLANT
COVER WAND RF STERILE (DRAPES) ×1 IMPLANT
DRAPE C-ARM 42X72 X-RAY (DRAPES) ×3 IMPLANT
DRAPE C-ARMOR (DRAPES) ×3 IMPLANT
DRAPE HALF SHEET 40X57 (DRAPES) IMPLANT
DRAPE INCISE IOBAN 66X45 STRL (DRAPES) IMPLANT
DRAPE ORTHO SPLIT 77X108 STRL (DRAPES) ×2
DRAPE SURG ORHT 6 SPLT 77X108 (DRAPES) IMPLANT
DRAPE U-SHAPE 47X51 STRL (DRAPES) ×3 IMPLANT
DRSG ADAPTIC 3X8 NADH LF (GAUZE/BANDAGES/DRESSINGS) ×3 IMPLANT
DRSG MEPITEL 4X7.2 (GAUZE/BANDAGES/DRESSINGS) ×9 IMPLANT
DRSG PAD ABDOMINAL 8X10 ST (GAUZE/BANDAGES/DRESSINGS) ×6 IMPLANT
DRSG VAC ATS LRG SENSATRAC (GAUZE/BANDAGES/DRESSINGS) ×2 IMPLANT
ELECT REM PT RETURN 9FT ADLT (ELECTROSURGICAL) ×3
ELECTRODE REM PT RTRN 9FT ADLT (ELECTROSURGICAL) ×1 IMPLANT
GAUZE SPONGE 4X4 12PLY STRL (GAUZE/BANDAGES/DRESSINGS) ×3 IMPLANT
GLOVE BIO SURGEON STRL SZ7.5 (GLOVE) ×5 IMPLANT
GLOVE BIO SURGEON STRL SZ8 (GLOVE) ×5 IMPLANT
GLOVE BIO SURGEON STRL SZ8.5 (GLOVE) ×3 IMPLANT
GLOVE BIOGEL PI IND STRL 7.5 (GLOVE) ×1 IMPLANT
GLOVE BIOGEL PI IND STRL 8 (GLOVE) ×1 IMPLANT
GLOVE BIOGEL PI INDICATOR 7.5 (GLOVE) ×2
GLOVE BIOGEL PI INDICATOR 8 (GLOVE) ×2
GOWN STRL REUS W/ TWL LRG LVL3 (GOWN DISPOSABLE) ×2 IMPLANT
GOWN STRL REUS W/ TWL XL LVL3 (GOWN DISPOSABLE) ×1 IMPLANT
GOWN STRL REUS W/TWL LRG LVL3 (GOWN DISPOSABLE) ×4
GOWN STRL REUS W/TWL XL LVL3 (GOWN DISPOSABLE) ×2
GUIDEWIRE BALL NOSE 80CM (WIRE) ×2 IMPLANT
HANDPIECE INTERPULSE COAX TIP (DISPOSABLE) ×2
KIT BASIN OR (CUSTOM PROCEDURE TRAY) ×3 IMPLANT
KIT INFUSE LRG II (Orthopedic Implant) ×2 IMPLANT
KIT TURNOVER KIT B (KITS) ×3 IMPLANT
MICROMATRIX 1000MG (Tissue) ×3 IMPLANT
NAIL TIBIAL 9MMX37.5CM (Nail) ×2 IMPLANT
PACK ORTHO EXTREMITY (CUSTOM PROCEDURE TRAY) ×3 IMPLANT
PAD ARMBOARD 7.5X6 YLW CONV (MISCELLANEOUS) ×6 IMPLANT
PAD CAST 4YDX4 CTTN HI CHSV (CAST SUPPLIES) ×1 IMPLANT
PAD NEG PRESSURE SENSATRAC (MISCELLANEOUS) ×2 IMPLANT
PADDING CAST COTTON 4X4 STRL (CAST SUPPLIES) ×2
PADDING CAST COTTON 6X4 STRL (CAST SUPPLIES) ×3 IMPLANT
SCREW ACECAP 42MM (Screw) ×2 IMPLANT
SCREW ACECAP 50MM (Screw) ×2 IMPLANT
SCREW PROXIMAL DEPUY (Screw) ×2 IMPLANT
SCREW PROXIMAL75MMLX5.5MM (Screw) ×2 IMPLANT
SCREW PRXML FT 55X5.5XNS TIB (Screw) IMPLANT
SET HNDPC FAN SPRY TIP SCT (DISPOSABLE) IMPLANT
SOLUTION PARTIC MCRMTRX 1000MG (Tissue) IMPLANT
SPONGE LAP 18X18 X RAY DECT (DISPOSABLE) ×4 IMPLANT
STAPLER VISISTAT 35W (STAPLE) ×3 IMPLANT
SUT ETHILON 2 0 PSLX (SUTURE) ×4 IMPLANT
SUT ETHILON 3 0 PS 1 (SUTURE) ×6 IMPLANT
SUT PDS AB 2-0 CT1 27 (SUTURE) ×4 IMPLANT
SUT VIC AB 0 CT1 27 (SUTURE)
SUT VIC AB 0 CT1 27XBRD ANBCTR (SUTURE) IMPLANT
SUT VIC AB 2-0 CT1 27 (SUTURE) ×2
SUT VIC AB 2-0 CT1 TAPERPNT 27 (SUTURE) ×1 IMPLANT
TOWEL GREEN STERILE (TOWEL DISPOSABLE) ×6 IMPLANT
TOWEL GREEN STERILE FF (TOWEL DISPOSABLE) ×3 IMPLANT
TUBE CONNECTING 12'X1/4 (SUCTIONS) ×1
TUBE CONNECTING 12X1/4 (SUCTIONS) ×1 IMPLANT
YANKAUER SUCT BULB TIP NO VENT (SUCTIONS) ×2 IMPLANT

## 2020-02-13 NOTE — Progress Notes (Signed)
Orthopedic Tech Progress Note Patient Details:  Jose Wagner June 10, 1996 [id number] OR RN called requesting a NIGHT TIME SPLINT large to xlarge for foot. Called [facility] for a STAT order for NIGHT TIME SPLINT for foot. [facility] said they would have to order that splint but a PRAFO would hold patient over until SPLINT is delivered. Told the OR RN what was said. Left PRAFO at desk. Ortho Devices Type of Ortho Device: Prafo boot/shoe Ortho Device/Splint Interventions: Other (comment)   Post Interventions Patient Tolerated: Other (comment) Instructions Provided: Other (comment)   [name] 02/13/2020, 4:13 PM

## 2020-02-13 NOTE — Anesthesia Procedure Notes (Signed)
Procedure Name: Intubation Date/Time: 02/13/2020 1:43 PM Performed by: Marena Chancy, CRNA Pre-anesthesia Checklist: Patient identified, Emergency Drugs available, Suction available and Patient being monitored Patient Re-evaluated:Patient Re-evaluated prior to induction Oxygen Delivery Method: Circle System Utilized Preoxygenation: Pre-oxygenation with 100% oxygen Induction Type: IV induction Ventilation: Mask ventilation without difficulty Laryngoscope Size: Miller and 2 Grade View: Grade I Tube type: Oral Tube size: 8.0 mm Number of attempts: 1 Airway Equipment and Method: Stylet and Oral airway Placement Confirmation: ETT inserted through vocal cords under direct vision,  positive ETCO2 and breath sounds checked- equal and bilateral Tube secured with: Tape Dental Injury: Teeth and Oropharynx as per pre-operative assessment

## 2020-02-13 NOTE — Plan of Care (Signed)

## 2020-02-13 NOTE — Social Work (Signed)
CSW met with pt at bedside. CSW introduced self and explained her role. CSW completed sbirt with pt. Pt. Scored a 0 on the sbirt scale. Pt denied alcohol use and substance use. Pt did not need resources.   Emeterio Reeve, Latanya Presser, [place] Social Worker [phone]

## 2020-02-13 NOTE — Brief Op Note (Signed)
02/13/2020  9:16 PM  PATIENT:  Roylene Reason  24 y.o. male  PRE-OPERATIVE DIAGNOSIS:   1. GRADE 3B OPEN RIGHT TIBIA FRACTURE 2. RIGHT HEEL WOUND 3 X 4 CM 3. RETAINED EXTERNAL FIXATOR  POST-OPERATIVE DIAGNOSIS:   1. GRADE 3B OPEN RIGHT TIBIA FRACTURE 2. RIGHT HEEL WOUND 3 X 4 CM 3. RETAINED EXTERNAL FIXATOR 4. STABLE SYNDESMOSIS  PROCEDURE:  Procedure(s) with comments: 1. INTRAMEDULLARY (IM) NAIL TIBIAL (Right) WITH BIOMET VERSANAIL 9 X 375  MM STATICALLY LOCKED NAIL 2. REMOVAL OF EXTERNAL FIXATOR UNDER ANESTHESIA 3. DEBRIDEMENT OF OPEN FRACTURE INCLUDING SKIN, SUBCUTANEOUS TISSUE, AND MUSCLE FASCIA 4. APPLICATION OF ACELL BIOLOGIC GRAFT 8 CM X 8 CM 5. WOUND VAC DRESSING CHANGE UNDER ANESTHESIA OPEN WOUNDS LEG AND ANKLE 6. STRESS FLUOROSCOPY OF THE RIGHT ANKLE  SURGEON:  Surgeon(s) and Role:    [name], MD - Primary  PHYSICIAN ASSISTANT: 1. KEITH PAUL, PA-C; 2. PA Student  ANESTHESIA:   general  EBL:  150 mL   BLOOD ADMINISTERED:none  DRAINS: none   LOCAL MEDICATIONS USED:  NONE  SPECIMEN:  No Specimen  DISPOSITION OF SPECIMEN:  N/A  COUNTS:  YES  TOURNIQUET:  * No tourniquets in log *  DICTATION: .Other Dictation: Dictation Number [id number]  PLAN OF CARE: Admit to inpatient   PATIENT DISPOSITION:  PACU - hemodynamically stable.   Delay start of Pharmacological VTE agent (>24hrs) due to surgical blood loss or risk of bleeding: no
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
## 2020-02-13 NOTE — Progress Notes (Signed)
Received from PACU via bed.
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
## 2020-02-13 NOTE — Transfer of Care (Signed)
Immediate Anesthesia Transfer of Care Note  Patient: Jose Wagner  Procedure(s) Performed: INTRAMEDULLARY (IM) NAIL TIBIAL (Right )  Patient Location: PACU  Anesthesia Type:General  Level of Consciousness: awake and patient cooperative  Airway & Oxygen Therapy: Patient Spontanous Breathing and Patient connected to face mask oxygen  Post-op Assessment: Report given to RN and Post -op Vital signs reviewed and stable  Post vital signs: Reviewed and stable  Last Vitals:  Vitals Value Taken Time  BP 142/59 02/13/20 1656  Temp 37.6 C 02/13/20 1655  Pulse 69 02/13/20 1702  Resp 14 02/13/20 1702  SpO2 100 % 02/13/20 1702  Vitals shown include unvalidated device data.  Last Pain:  Vitals:   02/13/20 1655  TempSrc:   PainSc: 0-No pain      Patients Stated Pain Goal: 3 (02/10/20 1715)  Complications: No apparent anesthesia complications

## 2020-02-13 NOTE — Anesthesia Postprocedure Evaluation (Signed)
Anesthesia Post Note  Patient: Jose Wagner  Procedure(s) Performed: INTRAMEDULLARY (IM) NAIL TIBIAL (Right )     Patient location during evaluation: PACU Anesthesia Type: General Level of consciousness: oriented, patient cooperative and sedated Pain management: pain level controlled (continuing pain medication) Vital Signs Assessment: post-procedure vital signs reviewed and stable Respiratory status: spontaneous breathing, nonlabored ventilation, respiratory function stable and patient connected to nasal cannula oxygen Cardiovascular status: blood pressure returned to baseline and stable Postop Assessment: no apparent nausea or vomiting Anesthetic complications: no    Last Vitals:  Vitals:   02/13/20 1655 02/13/20 1725  BP:  140/65  Pulse:  63  Resp:  10  Temp: 37.6 C   SpO2:  100%    Last Pain:  Vitals:   02/13/20 1755  TempSrc:   PainSc: 10-Worst pain ever                 Rahma Meller,E. Annaka Cleaver

## 2020-02-13 NOTE — Plan of Care (Signed)
  Problem: Education: Goal: Knowledge of General Education information will improve Description Including pain rating scale, medication(s)/side effects and non-pharmacologic comfort measures Outcome: Progressing   
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
## 2020-02-13 NOTE — Progress Notes (Signed)
PT Cancellation Note  Patient Details Name: Shloime Keilman MRN: [id number] DOB: [birth year]   Cancelled Treatment:    Reason Eval/Treat Not Completed: Patient at procedure or test/unavailable- to OR for repeat I&D of RLE and IMN R tibia. Will follow-up for PT treatment post-op as schedule permits.  Ina Homes, PT, DPT Acute Rehabilitation Services  Pager [phone] Office [phone]  Malachy Chamber 02/13/2020, 11:24 AM

## 2020-02-13 NOTE — Anesthesia Preprocedure Evaluation (Signed)
Anesthesia Evaluation  Patient identified by MRN, date of birth, ID band Patient awake    Reviewed: Allergy & Precautions, NPO status , Patient's Chart, lab work & pertinent test results  Airway Mallampati: II  TM Distance: >3 FB Neck ROM: Full    Dental no notable dental hx.    Pulmonary neg pulmonary ROS,    Pulmonary exam normal breath sounds clear to auscultation       Cardiovascular negative cardio ROS Normal cardiovascular exam Rhythm:Regular Rate:Normal     Neuro/Psych negative neurological ROS  negative psych ROS   GI/Hepatic negative GI ROS, Neg liver ROS,   Endo/Other  negative endocrine ROS  Renal/GU negative Renal ROS  negative genitourinary   Musculoskeletal negative musculoskeletal ROS (+)   Abdominal   Peds negative pediatric ROS (+)  Hematology  (+) anemia ,   Anesthesia Other Findings   Reproductive/Obstetrics negative OB ROS                             Anesthesia Physical Anesthesia Plan  ASA: II  Anesthesia Plan: General   Post-op Pain Management:    Induction: Intravenous  PONV Risk Score and Plan: 3 and Ondansetron, Dexamethasone, Midazolam and Treatment may vary due to age or medical condition  Airway Management Planned: Oral ETT  Additional Equipment:   Intra-op Plan:   Post-operative Plan: Extubation in OR  Informed Consent: I have reviewed the patients History and Physical, chart, labs and discussed the procedure including the risks, benefits and alternatives for the proposed anesthesia with the patient or authorized representative who has indicated his/her understanding and acceptance.     Dental advisory given  Plan Discussed with: CRNA and Surgeon  Anesthesia Plan Comments:         Anesthesia Quick Evaluation  

## 2020-02-14 ENCOUNTER — Encounter: Payer: Self-pay | Admitting: *Deleted

## 2020-02-14 LAB — CBC
HCT: 22.9 % — ABNORMAL LOW (ref 39.0–52.0)
Hemoglobin: 7.9 g/dL — ABNORMAL LOW (ref 13.0–17.0)
MCH: 29.4 pg (ref 26.0–34.0)
MCHC: 34.5 g/dL (ref 30.0–36.0)
MCV: 85.1 fL (ref 80.0–100.0)
Platelets: 302 10*3/uL (ref 150–400)
RBC: 2.69 MIL/uL — ABNORMAL LOW (ref 4.22–5.81)
RDW: 12.1 % (ref 11.5–15.5)
WBC: 11.8 10*3/uL — ABNORMAL HIGH (ref 4.0–10.5)
nRBC: 0 % (ref 0.0–0.2)

## 2020-02-14 MED ORDER — KETOROLAC TROMETHAMINE 15 MG/ML IJ SOLN
15.0000 mg | Freq: Three times a day (TID) | INTRAMUSCULAR | Status: AC
  Administered 2020-02-14 – 2020-02-17 (×9): 15 mg via INTRAVENOUS
  Filled 2020-02-14 (×9): qty 1

## 2020-02-14 MED ORDER — TRAMADOL HCL 50 MG PO TABS
50.0000 mg | ORAL_TABLET | Freq: Three times a day (TID) | ORAL | Status: DC | PRN
  Administered 2020-02-15: 100 mg via ORAL
  Administered 2020-02-17: 50 mg via ORAL
  Filled 2020-02-14: qty 2
  Filled 2020-02-14 (×2): qty 1
  Filled 2020-02-14: qty 2

## 2020-02-14 MED ORDER — HYDROCODONE-ACETAMINOPHEN 7.5-325 MG PO TABS
1.0000 | ORAL_TABLET | Freq: Four times a day (QID) | ORAL | Status: DC | PRN
  Administered 2020-02-14 – 2020-02-15 (×4): 2 via ORAL
  Filled 2020-02-14 (×5): qty 2

## 2020-02-14 MED ORDER — METHOCARBAMOL 1000 MG/10ML IJ SOLN
500.0000 mg | Freq: Three times a day (TID) | INTRAVENOUS | Status: DC
  Administered 2020-02-16: 500 mg via INTRAVENOUS
  Filled 2020-02-14 (×25): qty 5

## 2020-02-14 MED ORDER — METHOCARBAMOL 500 MG PO TABS
1000.0000 mg | ORAL_TABLET | Freq: Three times a day (TID) | ORAL | Status: DC
  Administered 2020-02-14 – 2020-02-21 (×20): 1000 mg via ORAL
  Filled 2020-02-14 (×20): qty 2

## 2020-02-14 NOTE — Plan of Care (Signed)
  Problem: Education: Goal: Knowledge of General Education information will improve Description: Including pain rating scale, medication(s)/side effects and non-pharmacologic comfort measures Outcome: Progressing   Problem: Health Behavior/Discharge Planning: Goal: Ability to manage health-related needs will improve Outcome: Progressing   Problem: Clinical Measurements: Goal: Cardiovascular complication will be avoided Outcome: Progressing   Problem: Activity: Goal: Risk for activity intolerance will decrease Outcome: Progressing   Problem: Nutrition: Goal: Adequate nutrition will be maintained Outcome: Progressing   Problem: Coping: Goal: Level of anxiety will decrease Outcome: Progressing   Problem: Pain Managment: Goal: General experience of comfort will improve Outcome: Progressing   Problem: Safety: Goal: Ability to remain free from injury will improve Outcome: Progressing   Problem: Skin Integrity: Goal: Risk for impaired skin integrity will decrease Outcome: Progressing   
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
## 2020-02-14 NOTE — Progress Notes (Signed)
Orthopedic Tech Progress Note Patient Details:  Jose Wagner 10-09-1996 [id number] Spoke with [name] and she's going to credit patient on PRAFO BOOT not used yesterday  Patient ID: Jose Wagner, male   DOB: 12/30/1995, 24 y.o.   MRN: [id number]   Jose Wagner 02/14/2020, 8:42 AM
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
## 2020-02-15 ENCOUNTER — Inpatient Hospital Stay (HOSPITAL_COMMUNITY): Payer: No Typology Code available for payment source

## 2020-02-15 LAB — CBC
HCT: 20.5 % — ABNORMAL LOW (ref 39.0–52.0)
Hemoglobin: 7 g/dL — ABNORMAL LOW (ref 13.0–17.0)
MCH: 29.8 pg (ref 26.0–34.0)
MCHC: 34.1 g/dL (ref 30.0–36.0)
MCV: 87.2 fL (ref 80.0–100.0)
Platelets: 306 10*3/uL (ref 150–400)
RBC: 2.35 MIL/uL — ABNORMAL LOW (ref 4.22–5.81)
RDW: 13 % (ref 11.5–15.5)
WBC: 9.9 10*3/uL (ref 4.0–10.5)
nRBC: 0 % (ref 0.0–0.2)

## 2020-02-15 MED ORDER — HYDROMORPHONE HCL 1 MG/ML IJ SOLN
1.0000 mg | Freq: Four times a day (QID) | INTRAMUSCULAR | Status: DC | PRN
  Administered 2020-02-15: 2 mg via INTRAVENOUS
  Administered 2020-02-17: 1 mg via INTRAVENOUS
  Filled 2020-02-15: qty 1
  Filled 2020-02-15 (×2): qty 2

## 2020-02-15 MED ORDER — ACETAMINOPHEN 500 MG PO TABS
500.0000 mg | ORAL_TABLET | Freq: Two times a day (BID) | ORAL | Status: DC
  Administered 2020-02-15 – 2020-02-21 (×10): 500 mg via ORAL
  Filled 2020-02-15 (×12): qty 1

## 2020-02-15 MED ORDER — GABAPENTIN 300 MG PO CAPS
300.0000 mg | ORAL_CAPSULE | Freq: Two times a day (BID) | ORAL | Status: DC
  Administered 2020-02-15 – 2020-02-21 (×12): 300 mg via ORAL
  Filled 2020-02-15 (×12): qty 1

## 2020-02-15 MED ORDER — FENTANYL CITRATE (PF) 100 MCG/2ML IJ SOLN
50.0000 ug | Freq: Once | INTRAMUSCULAR | Status: AC
  Administered 2020-02-15: 50 ug via INTRAVENOUS
  Filled 2020-02-15: qty 2

## 2020-02-15 MED ORDER — HYDROCODONE-ACETAMINOPHEN 10-325 MG PO TABS
1.0000 | ORAL_TABLET | Freq: Four times a day (QID) | ORAL | Status: DC | PRN
  Administered 2020-02-15 – 2020-02-17 (×4): 2 via ORAL
  Administered 2020-02-18: 1 via ORAL
  Filled 2020-02-15 (×2): qty 1
  Filled 2020-02-15 (×4): qty 2
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
## 2020-02-15 NOTE — Plan of Care (Signed)
  Problem: Education: Goal: Knowledge of General Education information will improve Description: Including pain rating scale, medication(s)/side effects and non-pharmacologic comfort measures Outcome: Progressing   Problem: Activity: Goal: Risk for activity intolerance will decrease Outcome: Progressing   Problem: Elimination: Goal: Will not experience complications related to bowel motility Outcome: Progressing   
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
## 2020-02-15 NOTE — Progress Notes (Signed)
Keith,PA made aware of pt's hgb of 7.0. No new orders at this time.

## 2020-02-16 ENCOUNTER — Inpatient Hospital Stay (HOSPITAL_COMMUNITY): Payer: No Typology Code available for payment source

## 2020-02-16 LAB — COMPREHENSIVE METABOLIC PANEL
ALT: 28 U/L (ref 0–44)
AST: 27 U/L (ref 15–41)
Albumin: 2.6 g/dL — ABNORMAL LOW (ref 3.5–5.0)
Alkaline Phosphatase: 34 U/L — ABNORMAL LOW (ref 38–126)
Anion gap: 8 (ref 5–15)
BUN: 14 mg/dL (ref 6–20)
CO2: 24 mmol/L (ref 22–32)
Calcium: 8.5 mg/dL — ABNORMAL LOW (ref 8.9–10.3)
Chloride: 104 mmol/L (ref 98–111)
Creatinine, Ser: 0.95 mg/dL (ref 0.61–1.24)
GFR calc Af Amer: >60 mL/min (ref >60)
GFR calc non Af Amer: >60 mL/min (ref >60)
Glucose, Bld: 106 mg/dL — ABNORMAL HIGH (ref 70–99)
Potassium: 4.2 mmol/L (ref 3.5–5.1)
Sodium: 136 mmol/L (ref 135–145)
Total Bilirubin: 1 mg/dL (ref 0.3–1.2)
Total Protein: 5.4 g/dL — ABNORMAL LOW (ref 6.5–8.1)

## 2020-02-16 LAB — CBC
HCT: 21.4 % — ABNORMAL LOW (ref 39.0–52.0)
Hemoglobin: 7.3 g/dL — ABNORMAL LOW (ref 13.0–17.0)
MCH: 29.8 pg (ref 26.0–34.0)
MCHC: 34.1 g/dL (ref 30.0–36.0)
MCV: 87.3 fL (ref 80.0–100.0)
Platelets: 367 10*3/uL (ref 150–400)
RBC: 2.45 MIL/uL — ABNORMAL LOW (ref 4.22–5.81)
RDW: 12.9 % (ref 11.5–15.5)
WBC: 11.4 10*3/uL — ABNORMAL HIGH (ref 4.0–10.5)
nRBC: 0 % (ref 0.0–0.2)

## 2020-02-16 LAB — SURGICAL PCR SCREEN
MRSA, PCR: NEGATIVE
Staphylococcus aureus: POSITIVE — AB

## 2020-02-16 MED ORDER — MUPIROCIN 2 % EX OINT
1.0000 "application " | TOPICAL_OINTMENT | Freq: Two times a day (BID) | CUTANEOUS | Status: AC
  Administered 2020-02-16 – 2020-02-21 (×10): 1 via TOPICAL
  Filled 2020-02-16: qty 22

## 2020-02-16 MED ORDER — CHLORHEXIDINE GLUCONATE CLOTH 2 % EX PADS
6.0000 | MEDICATED_PAD | Freq: Once | CUTANEOUS | Status: AC
  Administered 2020-02-17: 6 via TOPICAL

## 2020-02-16 MED ORDER — CHLORHEXIDINE GLUCONATE CLOTH 2 % EX PADS
6.0000 | MEDICATED_PAD | Freq: Once | CUTANEOUS | Status: AC
  Administered 2020-02-16: 6 via TOPICAL
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
## 2020-02-16 NOTE — Progress Notes (Signed)
Patient to be NPO after midnight tonight (02/16/20)

## 2020-02-16 NOTE — Plan of Care (Signed)

## 2020-02-17 ENCOUNTER — Encounter (HOSPITAL_COMMUNITY): Payer: Self-pay | Admitting: Student

## 2020-02-17 ENCOUNTER — Inpatient Hospital Stay (HOSPITAL_COMMUNITY): Payer: No Typology Code available for payment source | Admitting: Certified Registered"

## 2020-02-17 ENCOUNTER — Encounter (HOSPITAL_COMMUNITY)
Admission: EM | Disposition: A | Discharge status: Home / Self Care | Payer: Self-pay | Source: Home / Self Care | Attending: Orthopedic Surgery

## 2020-02-17 DIAGNOSIS — S82221C Displaced transverse fracture of shaft of right tibia, initial encounter for open fracture type IIIA, IIIB, or IIIC: Secondary | ICD-10-CM

## 2020-02-17 HISTORY — PX: APPLICATION OF A-CELL OF EXTREMITY: SHX6303

## 2020-02-17 HISTORY — PX: I & D EXTREMITY: SHX5045

## 2020-02-17 HISTORY — PX: APPLICATION OF WOUND VAC: SHX5189

## 2020-02-17 LAB — CBC
HCT: 23 % — ABNORMAL LOW (ref 39.0–52.0)
Hemoglobin: 7.6 g/dL — ABNORMAL LOW (ref 13.0–17.0)
MCH: 29 pg (ref 26.0–34.0)
MCHC: 33 g/dL (ref 30.0–36.0)
MCV: 87.8 fL (ref 80.0–100.0)
Platelets: 443 10*3/uL — ABNORMAL HIGH (ref 150–400)
RBC: 2.62 MIL/uL — ABNORMAL LOW (ref 4.22–5.81)
RDW: 13 % (ref 11.5–15.5)
WBC: 10.5 10*3/uL (ref 4.0–10.5)
nRBC: 0 % (ref 0.0–0.2)

## 2020-02-17 SURGERY — IRRIGATION AND DEBRIDEMENT EXTREMITY
Anesthesia: General | Site: Leg Lower | Laterality: Right

## 2020-02-17 MED ORDER — LACTATED RINGERS IV SOLN: INTRAVENOUS | Status: DC

## 2020-02-17 MED ORDER — SODIUM CHLORIDE 0.9 % IR SOLN
Status: DC | PRN
  Administered 2020-02-17: 1000 mL

## 2020-02-17 MED ORDER — VITAMIN D 25 MCG (1000 UNIT) PO TABS
2000.0000 [IU] | ORAL_TABLET | Freq: Every day | ORAL | Status: DC
  Administered 2020-02-17 – 2020-02-21 (×5): 2000 [IU] via ORAL
  Filled 2020-02-17 (×5): qty 2

## 2020-02-17 MED ORDER — SODIUM CHLORIDE 0.9% FLUSH
10.0000 mL | Freq: Two times a day (BID) | INTRAVENOUS | Status: DC
  Administered 2020-02-17 – 2020-02-21 (×7): 10 mL

## 2020-02-17 MED ORDER — DEXAMETHASONE SODIUM PHOSPHATE 10 MG/ML IJ SOLN
INTRAMUSCULAR | Status: DC | PRN
  Administered 2020-02-17: 10 mg via INTRAVENOUS

## 2020-02-17 MED ORDER — ADULT MULTIVITAMIN W/MINERALS CH
1.0000 | ORAL_TABLET | Freq: Every day | ORAL | Status: DC
  Administered 2020-02-17 – 2020-02-21 (×5): 1 via ORAL
  Filled 2020-02-17 (×5): qty 1

## 2020-02-17 MED ORDER — SODIUM CHLORIDE 0.9 % IV SOLN
INTRAVENOUS | Status: DC | PRN
  Administered 2020-02-17: 500 mL

## 2020-02-17 MED ORDER — CEFAZOLIN SODIUM-DEXTROSE 2-4 GM/100ML-% IV SOLN
INTRAVENOUS | Status: AC
  Filled 2020-02-17: qty 100

## 2020-02-17 MED ORDER — PHENYLEPHRINE 40 MCG/ML (10ML) SYRINGE FOR IV PUSH (FOR BLOOD PRESSURE SUPPORT)
PREFILLED_SYRINGE | INTRAVENOUS | Status: AC
  Filled 2020-02-17: qty 20

## 2020-02-17 MED ORDER — CEFAZOLIN SODIUM-DEXTROSE 2-3 GM-%(50ML) IV SOLR
INTRAVENOUS | Status: DC | PRN
  Administered 2020-02-17: 2 g via INTRAVENOUS

## 2020-02-17 MED ORDER — FENTANYL CITRATE (PF) 100 MCG/2ML IJ SOLN
INTRAMUSCULAR | Status: DC | PRN
  Administered 2020-02-17: 50 ug via INTRAVENOUS
  Administered 2020-02-17: 150 ug via INTRAVENOUS
  Administered 2020-02-17: 50 ug via INTRAVENOUS

## 2020-02-17 MED ORDER — ONDANSETRON HCL 4 MG/2ML IJ SOLN
INTRAMUSCULAR | Status: DC | PRN
  Administered 2020-02-17: 4 mg via INTRAVENOUS

## 2020-02-17 MED ORDER — PROPOFOL 10 MG/ML IV BOLUS
INTRAVENOUS | Status: DC | PRN
  Administered 2020-02-17: 200 mg via INTRAVENOUS

## 2020-02-17 MED ORDER — LIDOCAINE 2% (20 MG/ML) 5 ML SYRINGE
INTRAMUSCULAR | Status: DC | PRN
  Administered 2020-02-17: 40 mg via INTRAVENOUS

## 2020-02-17 MED ORDER — FENTANYL CITRATE (PF) 250 MCG/5ML IJ SOLN
INTRAMUSCULAR | Status: AC
  Filled 2020-02-17: qty 5

## 2020-02-17 MED ORDER — CEFAZOLIN SODIUM-DEXTROSE 2-4 GM/100ML-% IV SOLN: 2.0000 g | INTRAVENOUS | Status: DC

## 2020-02-17 MED ORDER — SODIUM CHLORIDE 0.9 % IV SOLN
INTRAVENOUS | Status: AC
  Filled 2020-02-17: qty 500000

## 2020-02-17 MED ORDER — ONDANSETRON HCL 4 MG/2ML IJ SOLN
INTRAMUSCULAR | Status: AC
  Filled 2020-02-17: qty 2

## 2020-02-17 MED ORDER — SODIUM CHLORIDE 0.9% FLUSH: 10.0000 mL | INTRAVENOUS | Status: DC | PRN

## 2020-02-17 MED ORDER — MIDAZOLAM HCL 2 MG/2ML IJ SOLN
INTRAMUSCULAR | Status: AC
  Filled 2020-02-17: qty 2

## 2020-02-17 MED ORDER — PROPOFOL 10 MG/ML IV BOLUS
INTRAVENOUS | Status: AC
  Filled 2020-02-17: qty 20

## 2020-02-17 MED ORDER — ASCORBIC ACID 500 MG PO TABS
1000.0000 mg | ORAL_TABLET | Freq: Every day | ORAL | Status: DC
  Administered 2020-02-17 – 2020-02-21 (×5): 1000 mg via ORAL
  Filled 2020-02-17 (×5): qty 2

## 2020-02-17 MED ORDER — DEXMEDETOMIDINE HCL IN NACL 200 MCG/50ML IV SOLN
INTRAVENOUS | Status: DC | PRN
  Administered 2020-02-17: 8 ug via INTRAVENOUS
  Administered 2020-02-17: 4 ug via INTRAVENOUS
  Administered 2020-02-17: 8 ug via INTRAVENOUS

## 2020-02-17 MED ORDER — DEXAMETHASONE SODIUM PHOSPHATE 10 MG/ML IJ SOLN
INTRAMUSCULAR | Status: AC
  Filled 2020-02-17: qty 1

## 2020-02-17 SURGICAL SUPPLY — 61 items
BLADE CLIPPER SURG (BLADE) IMPLANT
BNDG ELASTIC 4X5.8 VLCR STR LF (GAUZE/BANDAGES/DRESSINGS) ×2 IMPLANT
BNDG ELASTIC 6X5.8 VLCR STR LF (GAUZE/BANDAGES/DRESSINGS) ×2 IMPLANT
BNDG GAUZE ELAST 4 BULKY (GAUZE/BANDAGES/DRESSINGS) ×6 IMPLANT
CANISTER SUCT 3000ML PPV (MISCELLANEOUS) ×3 IMPLANT
CANISTER WOUND CARE 500ML ATS (WOUND CARE) ×3 IMPLANT
CHLORAPREP W/TINT 26 (MISCELLANEOUS) IMPLANT
CNTNR URN SCR LID CUP LEK RST (MISCELLANEOUS) IMPLANT
CONT SPEC 4OZ STRL OR WHT (MISCELLANEOUS) ×2
COVER SURGICAL LIGHT HANDLE (MISCELLANEOUS) ×3 IMPLANT
DRAPE HALF SHEET 40X57 (DRAPES) ×2 IMPLANT
DRAPE INCISE IOBAN 66X45 STRL (DRAPES) ×2 IMPLANT
DRAPE ORTHO SPLIT 77X108 STRL (DRAPES)
DRAPE SURG ORHT 6 SPLT 77X108 (DRAPES) IMPLANT
DRESSING HYDROCOLLOID 4X4 (GAUZE/BANDAGES/DRESSINGS) ×3 IMPLANT
DRSG ADAPTIC 3X8 NADH LF (GAUZE/BANDAGES/DRESSINGS) ×2 IMPLANT
DRSG CUTIMED SORBACT 7X9 (GAUZE/BANDAGES/DRESSINGS) ×2 IMPLANT
DRSG HYDROCOLLOID 4X4 (GAUZE/BANDAGES/DRESSINGS) ×2 IMPLANT
DRSG PAD ABDOMINAL 8X10 ST (GAUZE/BANDAGES/DRESSINGS) ×6 IMPLANT
DRSG VAC ATS LRG SENSATRAC (GAUZE/BANDAGES/DRESSINGS) IMPLANT
DRSG VAC ATS MED SENSATRAC (GAUZE/BANDAGES/DRESSINGS) ×2 IMPLANT
DRSG VAC ATS SM SENSATRAC (GAUZE/BANDAGES/DRESSINGS) IMPLANT
ELECT CAUTERY BLADE 6.4 (BLADE) ×5 IMPLANT
ELECT REM PT RETURN 9FT ADLT (ELECTROSURGICAL) ×3
ELECTRODE REM PT RTRN 9FT ADLT (ELECTROSURGICAL) ×1 IMPLANT
GAUZE 4X4 16PLY RFD (DISPOSABLE) ×2 IMPLANT
GAUZE SPONGE 4X4 12PLY STRL (GAUZE/BANDAGES/DRESSINGS) ×3 IMPLANT
GEL ULTRASOUND 20GR AQUASONIC (MISCELLANEOUS) IMPLANT
GLOVE BIO SURGEON STRL SZ 6.5 (GLOVE) ×4 IMPLANT
GLOVE BIO SURGEONS STRL SZ 6.5 (GLOVE) ×2
GOWN STRL REUS W/ TWL LRG LVL3 (GOWN DISPOSABLE) ×3 IMPLANT
GOWN STRL REUS W/TWL LRG LVL3 (GOWN DISPOSABLE) ×6
HANDPIECE INTERPULSE COAX TIP (DISPOSABLE)
KIT BASIN OR (CUSTOM PROCEDURE TRAY) ×3 IMPLANT
KIT TURNOVER KIT B (KITS) ×3 IMPLANT
MATRIX WOUND 3-LAYER 10X15 (Tissue) ×1 IMPLANT
MICROMATRIX 1000MG (Tissue) ×3 IMPLANT
NS IRRIG 1000ML POUR BTL (IV SOLUTION) ×3 IMPLANT
PACK GENERAL/GYN (CUSTOM PROCEDURE TRAY) ×3 IMPLANT
PAD ARMBOARD 7.5X6 YLW CONV (MISCELLANEOUS) ×6 IMPLANT
PAD NEG PRESSURE SENSATRAC (MISCELLANEOUS) IMPLANT
SET HNDPC FAN SPRY TIP SCT (DISPOSABLE) IMPLANT
SOLUTION PARTIC MCRMTRX 1000MG (Tissue) IMPLANT
STAPLER VISISTAT 35W (STAPLE) IMPLANT
STOCKINETTE IMPERVIOUS 9X36 MD (GAUZE/BANDAGES/DRESSINGS) IMPLANT
STOCKINETTE IMPERVIOUS LG (DRAPES) IMPLANT
SURGILUBE 2OZ TUBE FLIPTOP (MISCELLANEOUS) ×3 IMPLANT
SUT MNCRL AB 3-0 PS2 18 (SUTURE) ×4 IMPLANT
SUT MNCRL AB 4-0 PS2 18 (SUTURE) ×4 IMPLANT
SUT PROLENE 3 0 SH DA (SUTURE) ×2 IMPLANT
SUT SILK 4 0 P 3 (SUTURE) IMPLANT
SUT SILK 4 0 PS 2 (SUTURE) IMPLANT
SUT VIC AB 5-0 PS2 18 (SUTURE) ×11 IMPLANT
SYR BULB IRRIGATION 50ML (SYRINGE) ×2 IMPLANT
TOWEL GREEN STERILE (TOWEL DISPOSABLE) ×3 IMPLANT
TOWEL GREEN STERILE FF (TOWEL DISPOSABLE) IMPLANT
TUBE CONNECTING 12'X1/4 (SUCTIONS) ×1
TUBE CONNECTING 12X1/4 (SUCTIONS) ×2 IMPLANT
UNDERPAD 30X30 (UNDERPADS AND DIAPERS) IMPLANT
WOUND MATRIX 3-LAYER 10X15 (Tissue) ×1 IMPLANT
YANKAUER SUCT BULB TIP NO VENT (SUCTIONS) ×5 IMPLANT

## 2020-02-17 NOTE — Interval H&P Note (Signed)
History and Physical Interval Note:  02/17/2020 12:21 PM  Jose Wagner  has presented today for surgery, with the diagnosis of right lower leg wound.  The various methods of treatment have been discussed with the patient and family. After consideration of risks, benefits and other options for treatment, the patient has consented to  Procedure(s): IRRIGATION AND DEBRIDEMENT RIGHT LOWER LEG (Right) APPLICATION OF A-CELL OF EXTREMITY (Right) APPLICATION OF WOUND VAC (Right) as a surgical intervention.  The patient's history has been reviewed, patient examined, no change in status, stable for surgery.  I have reviewed the patient's chart and labs.  Questions were answered to the patient's satisfaction.     Alena Bills Rael Yo

## 2020-02-17 NOTE — Transfer of Care (Signed)
Immediate Anesthesia Transfer of Care Note  Patient: Altair Appenzeller  Procedure(s) Performed: IRRIGATION AND DEBRIDEMENT RIGHT LOWER LEG (Right Leg Lower) APPLICATION OF A-CELL OF EXTREMITY (Right Leg Lower) APPLICATION OF WOUND VAC (Right Leg Lower)  Patient Location: PACU  Anesthesia Type:General  Level of Consciousness: drowsy and patient cooperative  Airway & Oxygen Therapy: Patient Spontanous Breathing  Post-op Assessment: Report given to RN and Post -op Vital signs reviewed and stable  Post vital signs: Reviewed and stable  Last Vitals:  Vitals Value Taken Time  BP 128/53 02/17/20 1342  Temp    Pulse 84 02/17/20 1344  Resp 12 02/17/20 1344  SpO2 96 % 02/17/20 1344  Vitals shown include unvalidated device data.  Last Pain:  Vitals:   02/17/20 1100  TempSrc:   PainSc: 0-No pain      Patients Stated Pain Goal: 4 (02/13/20 1755)  Complications: No apparent anesthesia complications

## 2020-02-17 NOTE — Progress Notes (Signed)
Orthopedic Tech Progress Note Patient Details:  Jose Wagner 16-Nov-1995 [id number] And added fresh ice to the RLE Ortho Devices Type of Ortho Device: Buddy tape Ortho Device/Splint Location: LLE Ortho Device/Splint Interventions: Ordered, Application   Post Interventions Patient Tolerated: Well Instructions Provided: Care of device   [name] 02/17/2020, 10:08 AM

## 2020-02-17 NOTE — Anesthesia Procedure Notes (Signed)
Procedure Name: LMA Insertion Date/Time: 02/17/2020 12:47 PM Performed by: Rosiland Oz, CRNA Pre-anesthesia Checklist: Patient identified, Emergency Drugs available, Suction available, Patient being monitored and Timeout performed Patient Re-evaluated:Patient Re-evaluated prior to induction Oxygen Delivery Method: Circle system utilized Preoxygenation: Pre-oxygenation with 100% oxygen Induction Type: IV induction LMA: LMA inserted LMA Size: 5.0 Number of attempts: 1 Placement Confirmation: positive ETCO2 and breath sounds checked- equal and bilateral Tube secured with: Tape Dental Injury: Teeth and Oropharynx as per pre-operative assessment

## 2020-02-17 NOTE — Plan of Care (Signed)
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
## 2020-02-17 NOTE — Progress Notes (Signed)
PT Cancellation Note  Patient Details Name: Loi Rennaker MRN: [id number] DOB: 1996/08/01   Cancelled Treatment:    Reason Eval/Treat Not Completed: (P) Patient at procedure or test/unavailable(pt down to OR for plastic surgery to RLE.  Will f/u per POC.)   Dia Jefferys Artis Delay 02/17/2020, 11:26 AM Bonney Leitz , PTA Acute Rehabilitation Services Pager [phone] Office [phone]
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
## 2020-02-17 NOTE — Op Note (Signed)
DATE OF OPERATION: 02/17/2020  LOCATION: [facility] Operating Room Inpatient  PREOPERATIVE DIAGNOSIS: Right open leg fracture with wound in the  POSTOPERATIVE DIAGNOSIS: Same  PROCEDURE:  1.  Preparation of right leg wound 7 x 14 x 1 cm 2.  Preparation of right lateral ankle wound 4 x 4 x 1 cm 3.  Preparation of right knee wound 3 x 5 cm 4.  Right leg: placement of ACell 7 x 14 cm sheet and 500 mg of powder 5.  Right knee: Placement of ACell 4 x 4 centimeter sheet and 400 mg of powder 6.  Right knee wound: Placement of ACell 2 x 4 cm 100 mg of powder 7.  Placement of VAC to right leg and right ankle  SURGEON: Alan Ripper Sanger Natasha Paulson, DO  ASSISTANT: Keenan Bachelor, PA  EBL: 5 cc  CONDITION: Stable  COMPLICATIONS: None  INDICATION: The patient, Jose Wagner, is a 24 y.o. male born on Feb 06, 1996, is here for treatment of a open fracture of the right leg with a resulting wound.  The wound was on the anterior lateral aspect of the leg and the lateral aspect of the ankle.  These were full-thickness.  There was abrasion of the right knee as well with a small area that was also full-thickness.  He underwent orthopedic surgery for treatment of the fractures.  He has been getting back treatment for the past 3 days.   PROCEDURE DETAILS:  The patient was seen prior to surgery and marked.  The IV antibiotics were given. The patient was taken to the operating room and given a general anesthetic. A standard time out was performed and all information was confirmed by those in the room. SCD was not placed due to bilateral leg injuries.  None of right leg was prepped and draped with Betadine.  Copious amounts of irrigation with saline and antibiotic solution was used to irrigate the entire right leg.  This included the right knee right leg and right ankle.  Overall the leg wounds look good and there were no signs of infection.  Right Leg: The wound was 7 x 14 x 1 cm.  The ACell powder 500 mg 7 x 14 cm  ACell 3-layer sheet was placed on the wound.  The sheet was secured with 5-0 Vicryl.  The sorbact was applied on top and secured with the 5-0 Vicryl.  Several of the nylon sutures were removed and 3-0 Monocryl was placed instead.  This will help with postop recovery.  The VAC was placed with K-Y jelly over the wound.  We obtained an excellent seal..  Right ankle: The wound was 4 x 4 x 1 cm in size.  The ACell powder 400 mg and ACell 3-layer was placed that sheet was secured with the 5-0 Vicryl. The sorbact was place on top.  A slit was made in the dressing of the leg wound and the dressing was applied to the ankle.  The VAC was then secured to the leg wound and there was an excellent seal.  Right ankle:  The wound 3 x 5 cm was cleaned and the Acell sheet 3 x 5 cm and 100 mg powder was applied.  The adaptic was placed and secured the to skin with the 5-0 Vicryl.  The wound was dressed with the [facility] and gauze. The leg was dressed with the kerlex and ace wrap. The patient was placed back in the splint. The patient was allowed to wake up and taken to recovery room in  stable condition at the end of the case. The family was notified at the end of the case.   The advanced practice practitioner (APP) assisted throughout the case.  The APP was essential in retraction and counter traction when needed to make the case progress smoothly.  This retraction and assistance made it possible to see the tissue plans for the procedure.  The assistance was needed for blood control, tissue re-approximation and assisted with closure of the incision site.

## 2020-02-18 MED ORDER — DIPHENHYDRAMINE HCL 25 MG PO CAPS
25.0000 mg | ORAL_CAPSULE | Freq: Every evening | ORAL | Status: DC | PRN
  Administered 2020-02-18 – 2020-02-19 (×2): 25 mg via ORAL
  Filled 2020-02-18 (×2): qty 1
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
## 2020-02-18 NOTE — Plan of Care (Signed)

## 2020-02-18 NOTE — Plan of Care (Signed)
  Problem: Pain Managment: Goal: General experience of comfort will improve Outcome: Progressing   
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
## 2020-02-18 NOTE — Progress Notes (Signed)
Dressing changes complete to bilateral knees. Order for dressing changes daily. Left great toe dressing change completed before shift change 4/17.

## 2020-02-19 ENCOUNTER — Encounter (HOSPITAL_COMMUNITY): Payer: Self-pay | Admitting: Student

## 2020-02-19 LAB — CBC
HCT: 24.2 % — ABNORMAL LOW (ref 39.0–52.0)
Hemoglobin: 7.8 g/dL — ABNORMAL LOW (ref 13.0–17.0)
MCH: 29.3 pg (ref 26.0–34.0)
MCHC: 32.2 g/dL (ref 30.0–36.0)
MCV: 91 fL (ref 80.0–100.0)
Platelets: 543 10*3/uL — ABNORMAL HIGH (ref 150–400)
RBC: 2.66 MIL/uL — ABNORMAL LOW (ref 4.22–5.81)
RDW: 13.1 % (ref 11.5–15.5)
WBC: 12.4 10*3/uL — ABNORMAL HIGH (ref 4.0–10.5)
nRBC: 0 % (ref 0.0–0.2)
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
## 2020-02-19 NOTE — Plan of Care (Signed)

## 2020-02-20 ENCOUNTER — Encounter: Payer: Self-pay | Admitting: *Deleted
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
## 2020-02-20 NOTE — Progress Notes (Signed)
3 Days Post-Op  Subjective: Reports he is doing well. He reports no pain. +BM, + flatus.  No f/c/n/v. Sister at bedside and mother on the phone during evaluation.  He reports he would like to go home, but family expresses concern for patient's ability to safely enter his 2nd floor apartment. He also reports he is worried about his hospital bill due to being uninsured.  Objective: Vital signs in last 24 hours: Temp:  [97.7 F (36.5 C)-98.1 F (36.7 C)] 97.8 F (36.6 C) (04/19 0408) Pulse Rate:  [66-85] 66 (04/19 0408) Resp:  [17-18] 17 (04/19 0408) BP: (134-143)/(59-67) 134/67 (04/19 0408) SpO2:  [98 %-99 %] 98 % (04/19 0408) Last BM Date: 02/19/20  Intake/Output from previous day: 04/18 0701 - 04/19 0700 In: 3171.8 [P.O.:720; I.V.:2451.8] Out: 2820 [Urine:2775; Drains:45] Intake/Output this shift: No intake/output data recorded.  General appearance: alert, cooperative, no distress and resting in bed, well appearing Head: Normocephalic, without obvious abnormality, atraumatic Resp: symmetric rise and fall, unlabored Extremities: RLE with boot in place, Wound vac in place with serosanguinous drainage in canister ~ 50 cc. Good color of distal toes. No erythema noted.  Lab Results:  CBC Latest Ref Rng & Units 02/19/2020 02/17/2020 02/16/2020  WBC 4.0 - 10.5 K/uL 12.4(H) 10.5 11.4(H)  Hemoglobin 13.0 - 17.0 g/dL 7.8(L) 7.6(L) 7.3(L)  Hematocrit 39.0 - 52.0 % 24.2(L) 23.0(L) 21.4(L)  Platelets 150 - 400 K/uL 543(H) 443(H) 367    BMET No results for input(s): NA, K, CL, CO2, GLUCOSE, BUN, CREATININE, CALCIUM in the last 72 hours. PT/INR No results for input(s): LABPROT, INR in the last 72 hours. ABG No results for input(s): PHART, HCO3 in the last 72 hours.  Invalid input(s): PCO2, PO2  Studies/Results: No results found.  Anti-infectives: Anti-infectives (From admission, onward)   Start     Dose/Rate Route Frequency Ordered Stop   02/17/20 1316  polymyxin B 500,000  Units, bacitracin 50,000 Units in sodium chloride 0.9 % 500 mL irrigation  Status:  Discontinued       As needed 02/17/20 1317 02/17/20 1341   02/17/20 0800  ceFAZolin (ANCEF) IVPB 2g/100 mL premix  Status:  Discontinued     2 g 200 mL/hr over 30 Minutes Intravenous To [facility] 02/17/20 0747 02/17/20 1412   02/13/20 2000  cefTRIAXone (ROCEPHIN) 2 g in sodium chloride 0.9 % 100 mL IVPB     2 g 200 mL/hr over 30 Minutes Intravenous Every 24 hours 02/13/20 1845 02/15/20 2130   02/13/20 0600  ceFAZolin (ANCEF) IVPB 2g/100 mL premix     2 g 200 mL/hr over 30 Minutes Intravenous On call to O.R. 02/12/20 1508 02/13/20 1422   02/09/20 2200  cefTRIAXone (ROCEPHIN) 2 g in sodium chloride 0.9 % 100 mL IVPB     2 g 200 mL/hr over 30 Minutes Intravenous Every 24 hours 02/09/20 2139 02/11/20 2213   02/09/20 1800  tobramycin (NEBCIN) powder  Status:  Discontinued       As needed 02/09/20 1800 02/09/20 1812   02/09/20 1800  vancomycin (VANCOCIN) powder  Status:  Discontinued       As needed 02/09/20 1801 02/09/20 1812      Assessment/Plan: s/p Procedure(s): IRRIGATION AND DEBRIDEMENT RIGHT LOWER LEG APPLICATION OF A-CELL OF EXTREMITY APPLICATION OF WOUND VAC  Jose Wagner is doing well. He reports pain is significantly improved and has no pain at this time.  Patient is stable for discharge from plastic surgery stance. I discussed this with the family during  evaluation and his family is worried about his ability to navigate at home and enter his 2nd floor apartment due to him not ambulating with crutches since admission, this is a valid concern and I advised them that although he is okay for d/c from plastic surgery stance, that the final decision is up to the primary team and PT recommendations.  We will plan for RLE split thickness skin graft with Dr. Marla Roe in 1-2 weeks, will evaluate wound later this week and determine final timeline. Will need wound vac change on Thursday/Friday. If  patient is discharged and unable to have wound vac changes, he can do daily dressing changes with [place] Jelly, 4x4 gauze, Kerlix, ACE wraps.   ROM: Defer to primary team/ortho.   ID: No further antibiotics necessary per plastics.  Order placed for case management assistance with obtaining wound vac for home if pt is d/c.  I spoke with Claiborne Billings from [facility] about inpatient rehab and further surgeries. We discussed that patient will likely need skin graft in the next 1-2 weeks, possibly next wed/thursday. This could be an outpatient same day surgery if patient is discharged.   VTE ppx with lovenox.  Please call with questions or concerns.     LOS: 11 days    Jose Constable, PA-C 02/20/2020

## 2020-02-20 NOTE — TOC Progression Note (Signed)
Transition of Care [facility]) - Progression Note    Patient Details  Name: Jose Wagner MRN: [id number] Date of Birth: 1996/07/16  Transition of Care [facility]) CM/SW Contact  Epifanio Lesches, RN Phone Number: [phone] 02/20/2020, 6:08 PM  Clinical Narrative:    NCM received consult: Patient possibly discharged pending ortho/PT recommendations. Patient without insurance, would like to see if patient would be eligible for charity wound vac.   NCM spoke with pt and confirmed pt is  without health insurance.  .  NCM has made referral with  [facility] [employer] for charity wound vac. Pt to complete charity application for wound vac.  [facility] [facility] Therapy Order Pad form left on front of chart for MD to complete and sign.   NCM to f/u in am and fax completed forms to [facility] for processing @ [phone].  Pt will  need HH nurse to f/u with wound vac.  Per PT/OT recommendations pt can benefit from a 3 in 1 bedside commode;Tub/shower bench;Wheelchair /  Gaffer. Orders noted for DME. Referral will be made in am for charity care with [employer].     Expected Discharge Plan: Home/Self Care Barriers to Discharge: Continued Medical Work up  Expected Discharge Plan and Services Expected Discharge Plan: Home/Self Care         Social Determinants of Health (SDOH) Interventions    Readmission Risk Interventions No flowsheet data found.

## 2020-02-20 NOTE — Anesthesia Preprocedure Evaluation (Signed)
Anesthesia Evaluation  Patient identified by MRN, date of birth, ID band Patient awake    Reviewed: Allergy & Precautions, H&P , NPO status , Patient's Chart, lab work & pertinent test results  Airway Mallampati: II   Neck ROM: full    Dental   Pulmonary neg pulmonary ROS,    breath sounds clear to auscultation       Cardiovascular negative cardio ROS   Rhythm:regular Rate:Normal     Neuro/Psych    GI/Hepatic   Endo/Other    Renal/GU      Musculoskeletal   Abdominal   Peds  Hematology   Anesthesia Other Findings   Reproductive/Obstetrics                             Anesthesia Physical  Anesthesia Plan  ASA: I  Anesthesia Plan: General   Post-op Pain Management:    Induction: Intravenous  PONV Risk Score and Plan: 2 and Ondansetron, Dexamethasone, Midazolam and Treatment may vary due to age or medical condition  Airway Management Planned: LMA  Additional Equipment:   Intra-op Plan:   Post-operative Plan: Extubation in OR  Informed Consent: I have reviewed the patients History and Physical, chart, labs and discussed the procedure including the risks, benefits and alternatives for the proposed anesthesia with the patient or authorized representative who has indicated his/her understanding and acceptance.       Plan Discussed with: CRNA, Anesthesiologist and Surgeon  Anesthesia Plan Comments:         Anesthesia Quick Evaluation  

## 2020-02-20 NOTE — Progress Notes (Addendum)
Dressing to R knee changed per order. Dressing to L knee and L great toe changed. 

## 2020-02-20 NOTE — Progress Notes (Signed)
Orthopedic Tech Progress Note Patient Details:  Jose Wagner May 19, 1996 [id number]  Ortho Devices Type of Ortho Device: Postop shoe/boot Ortho Device/Splint Location: LLE Ortho Device/Splint Interventions: Ordered, Application   Post Interventions Patient Tolerated: Well Instructions Provided: Care of device   [name] 02/20/2020, 12:33 PM

## 2020-02-20 NOTE — Progress Notes (Signed)
Inpatient Rehab Admissions:  Inpatient Rehab Consult received.  I met with pt and his sister at the bedside for rehabilitation assessment and to discuss goals and expectations of an inpatient rehab admission. Feel pt is a good IP Rehab candidate at this time. AC discussed recommended rehab program, expectations of CIR program, and anticipated LOS and outcomes. Pt interested in a rehab program to help get him back on his feet but did have concerns about the potential cost. Per his request, [facility] contacted financial counselor for assistance with this case. Pt unsure about the rest of his medical course and if/when he has any more follow up surgeries planned. As this may affect the timing of his rehab course, AC will follow for clarification regarding possible future surgeries and procedures prior to planning post acute rehab.   Raechel Ache, OTR/L  Rehab Admissions Coordinator  [phone] 02/20/2020 11:46 AM

## 2020-02-20 NOTE — Plan of Care (Signed)

## 2020-02-20 NOTE — Progress Notes (Signed)
Dressing to R knee changed per order. Dressing to L knee and L great toe changed.
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
## 2020-02-20 NOTE — Anesthesia Postprocedure Evaluation (Signed)
Anesthesia Post Note  Patient: Jose Wagner  Procedure(s) Performed: IRRIGATION AND DEBRIDEMENT RIGHT LOWER LEG (Right Leg Lower) APPLICATION OF A-CELL OF EXTREMITY (Right Leg Lower) APPLICATION OF WOUND VAC (Right Leg Lower)     Patient location during evaluation: PACU Anesthesia Type: General Level of consciousness: awake and alert Pain management: pain level controlled Vital Signs Assessment: post-procedure vital signs reviewed and stable Respiratory status: spontaneous breathing, nonlabored ventilation, respiratory function stable and patient connected to nasal cannula oxygen Cardiovascular status: blood pressure returned to baseline and stable Postop Assessment: no apparent nausea or vomiting Anesthetic complications: no    Last Vitals:  Vitals:   02/20/20 0408 02/20/20 0729  BP: 134/67 119/63  Pulse: 66 75  Resp: 17 17  Temp: 36.6 C 36.7 C  SpO2: 98% 95%    Last Pain:  Vitals:   02/20/20 0729  TempSrc: Oral  PainSc:                  Findley Blankenbaker S
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
## 2020-02-21 ENCOUNTER — Encounter (HOSPITAL_COMMUNITY): Payer: Self-pay | Admitting: Student

## 2020-02-21 ENCOUNTER — Telehealth: Payer: Self-pay | Admitting: Plastic Surgery

## 2020-02-21 DIAGNOSIS — S92912A Unspecified fracture of left toe(s), initial encounter for closed fracture: Secondary | ICD-10-CM

## 2020-02-21 HISTORY — DX: Unspecified fracture of left toe(s), initial encounter for closed fracture: S92.912A

## 2020-02-21 MED ORDER — METHOCARBAMOL 500 MG PO TABS: 500.0000 mg | ORAL_TABLET | Freq: Three times a day (TID) | ORAL | 0 refills | Status: DC | PRN

## 2020-02-21 MED ORDER — ENOXAPARIN SODIUM 40 MG/0.4ML ~~LOC~~ SOLN: 40.0000 mg | SUBCUTANEOUS | 0 refills | Status: DC

## 2020-02-21 MED ORDER — DOCUSATE SODIUM 100 MG PO CAPS: 100.0000 mg | ORAL_CAPSULE | Freq: Two times a day (BID) | ORAL | 0 refills | Status: DC

## 2020-02-21 MED ORDER — ACETAMINOPHEN 500 MG PO TABS: 500.0000 mg | ORAL_TABLET | Freq: Two times a day (BID) | ORAL | 0 refills | Status: DC

## 2020-02-21 MED ORDER — VITAMIN D3 25 MCG PO TABS: 2000.0000 [IU] | ORAL_TABLET | Freq: Every day | ORAL | 1 refills | Status: AC

## 2020-02-21 MED ORDER — GABAPENTIN 300 MG PO CAPS: 300.0000 mg | ORAL_CAPSULE | Freq: Two times a day (BID) | ORAL | 0 refills | Status: DC

## 2020-02-21 MED ORDER — ASCORBIC ACID 1000 MG PO TABS: 1000.0000 mg | ORAL_TABLET | Freq: Every day | ORAL | 1 refills | Status: AC

## 2020-02-21 MED ORDER — HYDROCODONE-ACETAMINOPHEN 10-325 MG PO TABS: 1.0000 | ORAL_TABLET | Freq: Three times a day (TID) | ORAL | 0 refills | Status: DC | PRN

## 2020-02-21 MED FILL — VITAMIN D3 25 MCG TABS: 25 | 30 days supply | Qty: 60 | Fill #0

## 2020-02-21 MED FILL — ACETAMINOPHEN 500MG XT STRE: 500 | 30 days supply | Qty: 60 | Fill #0

## 2020-02-21 MED FILL — ENOXAPARIN SODIUM 40 MG/0.4: 40 | 21 days supply | Qty: 8 | Fill #0

## 2020-02-21 MED FILL — METHOCARBAMOL 500 MG TABS: 500 | 10 days supply | Qty: 60 | Fill #0

## 2020-02-21 MED FILL — VITAMIN C 500 MG TABLET: 500 | 30 days supply | Qty: 60 | Fill #0

## 2020-02-21 MED FILL — HYDROCODON-APAP 10-325: 10-325 | 7 days supply | Qty: 21 | Fill #0

## 2020-02-21 MED FILL — DOK 100 MG CAPS: 100 | 5 days supply | Qty: 10 | Fill #0

## 2020-02-21 MED FILL — GABAPENTIN 300 MG CAPSULE: 300 | 21 days supply | Qty: 42 | Fill #0
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
## 2020-02-21 NOTE — TOC Transition Note (Addendum)
Transition of Care [facility]) - CM/SW Discharge Note   Patient Details  Name: Jose Wagner MRN: [id number] Date of Birth: 1996-05-29  Transition of Care [facility]) CM/SW Contact:  Epifanio Lesches, RN Phone Number: [phone] 02/21/2020, 9:27 AM   Clinical Narrative:    Pt will transition to home today with girlfriend once DME( ROLLING WALKER, W/C, 3in1/BSC) and wound vac ( [employer] [phone]- has been delivered to the bedside. Family to provide transportation to home .  Pt didn't qualify for charity [facility] services. Pt declined services 2/2 out of pocket expense. States he doesn't need it. Pt to f/u with Plastics in clinic on Friday for wound vac drsg change.  Match letter given to assist with Rx med cost. [facility] pharmacy to delivered Rx meds prior to d/c.  Family to provide transportation to home. Final next level of care: Home w Home Health Services Barriers to Discharge: No Barriers Identified   Patient Goals and CMS Choice     Choice offered to / list presented to : Patient(Pt without insurance, Charity case)  Discharge Placement                       Discharge Plan and Services                DME Arranged: 3-N-1, Wheelchair manual, rolling walker DME Agency: [employer] Date DME Agency Contacted: 02/21/20 Time DME Agency Contacted: [phone] Representative spoke with at DME Agency: Glennon Mac  Social Determinants of Health (SDOH) Interventions     Readmission Risk Interventions No flowsheet data found.
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
## 2020-02-21 NOTE — Discharge Instructions (Signed)
Wound Care with Acell  Guide to Wound Care  Proper wound care may reduce the risk of infection, improve healing rates, and limit scarring.  This is a general guide to help care for and manage wounds treated with ACell MicroMatrix?or Cytal Wound Matrix.   Dressing Changes The frequency of dressing changes can vary based on which product was applied, the size of the wound, or the amount of wound drainage. Dressing inspections are recommended, at least weekly.   If you have a Wound VAC it will be changed in one week after the first time it is applied.  Then it will be changed once or twice a week.   If you don't have a Wound VAC, then place KY gel on the wound daily and cover with gauze.  Dressing Types Primary Dressing:  Non-adherent dressing goes directly over wounds being treated with the powder or sheet (MicroMatrix and/or Cytal).  Secondary Dressing:  Secures the primary dressing in place and provides extra protection, compression, and absorption.  1. Wash Hands - To help decrease the risk of infection, caregivers should wash their hands for a minimum of 20 seconds and may use medical gloves.   2. Remove the Dressings - Avoid removing product from the wound by carefully removing the applicable dressing(s) at the time points recommended above, or as recommended by the treating physician.  Expected Color and Odor:  It is entirely normal for the wound to have an unpleasant odor and to form a caramel-colored gel as the product absorbs into the wound. It is  important to leave this gel on the wound site.  3. Clean the Wound - Use clean water or saline to gently rinse around the wound surface and remove any excess discharge that may be present on the wound. Do not wipe off any of the caramel-colored gel on the wound.   What to look out for: . Large or increased amount of drainage  . Surrounding skin has worsening redness or hot to touch  . Increased pain in or around the wound  . Flu-like  symptoms, fatigue, decreased appetite, fever  . Hard, crusty wound surface with black or brown coloring  4. Apply New Dressings - Dressings should cover the entire wound and be suitable for maintaining a moist wound environment.  The non-adherent mesh dressing should be left in place.  New dressing should consist of KY Jelly to keep the wound moist and soft gauze secured with a wrap or tape.   Maintain a Hydrated Wound Area It is important to keep the wound area moist throughout the healing process. If the wound appears to be dry during dressing changes, select a dressing that will hydrate the wound and maintain that ideal moist environment. If you are unsure what to do, ask the treating physician.  Remodeling Process Every patient heals differently, and no two cases are the same. The size and location of the wound, product type and layering configurations, and general patient health all contribute to how quickly a wound will heal.  While many factors can influence the rate at which the product absorbs, the following can be used as a general guide.   THINGS TO DO: Refrain from smoking High protein diet with plenty of vegetables and some fruit  Limit simple processed carbohydrates and sugar Protect the wound from trauma Protect the dressing  Micromatrix powder       Cytal Sheet            Sorbact dressing  Orthopaedic Trauma Service Discharge Instructions   General Discharge Instructions  Orthopaedic Injuries:  Open right tibia fracture treated with intramedullary nailing              Soft tissue injury being treated by the plastic surgery service  WEIGHT BEARING STATUS: Nonweightbearing right leg, use crutches to mobilize  RANGE OF MOTION/ACTIVITY: Unrestricted range of motion right knee.  Gentle range of motion of right ankle  Wound Care: per plastic surgery recommendations   DVT/PE prophylaxis: Lovenox subcutaneous injection daily x 21 days   Diet: as you were eating  previously.  Can use over the counter stool softeners and bowel preparations, such as Miralax, to help with bowel movements.  Narcotics can be constipating.  Be sure to drink plenty of fluids  PAIN MEDICATION USE AND EXPECTATIONS  You have likely been given narcotic medications to help control your pain.  After a traumatic event that results in an fracture (broken bone) with or without surgery, it is ok to use narcotic pain medications to help control one's pain.  We understand that everyone responds to pain differently and each individual patient will be evaluated on a regular basis for the continued need for narcotic medications. Ideally, narcotic medication use should last no more than 6-8 weeks (coinciding with fracture healing).   As a patient it is your responsibility as well to monitor narcotic medication use and report the amount and frequency you use these medications when you come to your office visit.   We would also advise that if you are using narcotic medications, you should take a dose prior to therapy to maximize you participation.  IF YOU ARE ON NARCOTIC MEDICATIONS IT IS NOT PERMISSIBLE TO OPERATE A MOTOR VEHICLE (MOTORCYCLE/CAR/TRUCK/MOPED) OR HEAVY MACHINERY DO NOT MIX NARCOTICS WITH OTHER CNS (CENTRAL NERVOUS SYSTEM) DEPRESSANTS SUCH AS ALCOHOL   STOP SMOKING OR USING NICOTINE PRODUCTS!!!!  As discussed nicotine severely impairs your body's ability to heal surgical and traumatic wounds but also impairs bone healing.  Wounds and bone heal by forming microscopic blood vessels (angiogenesis) and nicotine is a vasoconstrictor (essentially, shrinks blood vessels).  Therefore, if vasoconstriction occurs to these microscopic blood vessels they essentially disappear and are unable to deliver necessary nutrients to the healing tissue.  This is one modifiable factor that you can do to dramatically increase your chances of healing your injury.    (This means no smoking, no nicotine gum,  patches, etc)  DO NOT USE NONSTEROIDAL ANTI-INFLAMMATORY DRUGS (NSAID'S)  Using products such as Advil (ibuprofen), Aleve (naproxen), Motrin (ibuprofen) for additional pain control during fracture healing can delay and/or prevent the healing response.  If you would like to take over the counter (OTC) medication, Tylenol (acetaminophen) is ok.  However, some narcotic medications that are given for pain control contain acetaminophen as well. Therefore, you should not exceed more than 4000 mg of tylenol in a day if you do not have liver disease.  Also note that there are may OTC medicines, such as cold medicines and allergy medicines that my contain tylenol as well.  If you have any questions about medications and/or interactions please ask your doctor/PA or your pharmacist.      ICE AND ELEVATE INJURED/OPERATIVE EXTREMITY  Using ice and elevating the injured extremity above your heart can help with swelling and pain control.  Icing in a pulsatile fashion, such as 20 minutes on and 20 minutes off, can be followed.    Do not place ice directly on skin. Make  sure there is a barrier between to skin and the ice pack.    Using frozen items such as frozen peas works well as the conform nicely to the are that needs to be iced.  USE AN ACE WRAP OR TED HOSE FOR SWELLING CONTROL  In addition to icing and elevation, Ace wraps or TED hose are used to help limit and resolve swelling.  It is recommended to use Ace wraps or TED hose until you are informed to stop.    When using Ace Wraps start the wrapping distally (farthest away from the body) and wrap proximally (closer to the body)   Example: If you had surgery on your leg or thing and you do not have a splint on, start the ace wrap at the toes and work your way up to the thigh        If you had surgery on your upper extremity and do not have a splint on, start the ace wrap at your fingers and work your way up to the upper arm  IF YOU ARE IN A SPLINT OR CAST DO  NOT [place]   If your splint gets wet for any reason please contact the office immediately. You may shower in your splint or cast as long as you keep it dry.  This can be done by wrapping in a cast cover or garbage back (or similar)  Do Not stick any thing down your splint or cast such as pencils, money, or hangers to try and scratch yourself with.  If you feel itchy take benadryl as prescribed on the bottle for itching  IF YOU ARE IN A CAM BOOT (BLACK BOOT)  You may remove boot periodically. Perform daily dressing changes as noted below.  Wash the liner of the boot regularly and wear a sock when wearing the boot. It is recommended that you sleep in the boot until told otherwise    Call office for the following:  Temperature greater than 101F  Persistent nausea and vomiting  Severe uncontrolled pain  Redness, tenderness, or signs of infection (pain, swelling, redness, odor or green/yellow discharge around the site)  Difficulty breathing, headache or visual disturbances  Hives  Persistent dizziness or light-headedness  Extreme fatigue  Any other questions or concerns you may have after discharge  In an emergency, call 911 or go to an Emergency Department at a nearby hospital    [place]: [phone]   VISIT OUR WEBSITE FOR ADDITIONAL INFORMATION: [email or web address]

## 2020-02-21 NOTE — Telephone Encounter (Signed)
Jose Wagner, Nurse Case Mgr, called to see what the wound care plans are for this patient. Per notes, patient should have wound vac changed this Thursday or Friday. Please call her to advise what she needs to arrange for the patient. She is happy to speak with either [facility] or Dr. Ulice Bold. [phone]
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
## 2020-02-21 NOTE — Progress Notes (Signed)
Inpatient Rehabilitation-Admissions Coordinator   Met with pt bedside. He has decided to forgo CIR and DC straight home. Pt feels he has enough support at home from family and friends at this time. AC will sign off and will communicate pt's preference to [facility] team.   Please call if questions.   Raechel Ache, OTR/L  Rehab Admissions Coordinator  [phone] 02/21/2020 11:46 AM

## 2020-02-21 NOTE — Plan of Care (Signed)

## 2020-02-21 NOTE — Telephone Encounter (Signed)
I spoke with Marylene Land with the epic messaging, I informed her that patient could come to see me this Friday and I will change his wound VAC.  Patient has declined home health RN due to being uninsured and not wanting to pay out-of-pocket.  I will see him on Friday and change wound VAC and discuss further surgical plans.

## 2020-02-21 NOTE — Progress Notes (Signed)
Provided discharge education/instructions, all questions and concerns addressed, Pt not in distress. DME delivered to room, switched to portable wound vac, dressing clean, dry and intact, no leakage or blockage error noted on th wound vac, TOC meds delivered to room. Discharged home with all belongings accompanied by girlfriend and his mother.

## 2020-02-21 NOTE — Progress Notes (Signed)
Daily dressing change to R knee done, also changed dressing to L knee and L great toe.

## 2020-02-22 NOTE — Care Management Important Message (Signed)
Important Message  Patient Details  Name: Jose Wagner MRN: [id number] Date of Birth: 05-06-1996   Medicare Important Message Given:  Yes  Patient discharged prior to IM delivery . IM mailed to patient. Dorena Bodo 02/22/2020, 10:16 AM

## 2020-02-24 ENCOUNTER — Ambulatory Visit (INDEPENDENT_AMBULATORY_CARE_PROVIDER_SITE_OTHER): Payer: Self-pay | Admitting: Surgical

## 2020-02-24 ENCOUNTER — Other Ambulatory Visit: Payer: Self-pay

## 2020-02-24 ENCOUNTER — Encounter: Payer: Self-pay | Admitting: Surgical

## 2020-02-24 DIAGNOSIS — S81801D Unspecified open wound, right lower leg, subsequent encounter: Secondary | ICD-10-CM
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
## 2020-02-28 ENCOUNTER — Encounter: Payer: Self-pay | Admitting: Internal Medicine

## 2020-02-28 ENCOUNTER — Ambulatory Visit: Payer: Self-pay | Admitting: Surgical

## 2020-02-28 ENCOUNTER — Ambulatory Visit (INDEPENDENT_AMBULATORY_CARE_PROVIDER_SITE_OTHER): Payer: Self-pay | Admitting: Internal Medicine

## 2020-02-28 ENCOUNTER — Other Ambulatory Visit: Payer: Self-pay

## 2020-02-28 VITALS — BP 132/80 | HR 104 | Temp 98.7°F | Ht 73.0 in | Wt 230.0 lb

## 2020-02-28 DIAGNOSIS — S91009A Unspecified open wound, unspecified ankle, initial encounter: Secondary | ICD-10-CM

## 2020-02-28 DIAGNOSIS — S81801D Unspecified open wound, right lower leg, subsequent encounter: Secondary | ICD-10-CM

## 2020-02-28 NOTE — Patient Instructions (Addendum)
Jose Wagner   It was a pleasure meeting you today.    An x-ray of your right foot has been ordered.    Please follow-up with me next week

## 2020-03-02 ENCOUNTER — Other Ambulatory Visit: Payer: Self-pay

## 2020-03-02 ENCOUNTER — Ambulatory Visit (INDEPENDENT_AMBULATORY_CARE_PROVIDER_SITE_OTHER): Payer: Self-pay | Admitting: Surgical

## 2020-03-02 ENCOUNTER — Encounter: Payer: Self-pay | Admitting: Surgical

## 2020-03-02 DIAGNOSIS — S81801D Unspecified open wound, right lower leg, subsequent encounter: Secondary | ICD-10-CM
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
## 2020-03-05 ENCOUNTER — Inpatient Hospital Stay: Payer: Self-pay | Admitting: Critical Care Medicine

## 2020-03-05 ENCOUNTER — Ambulatory Visit: Payer: Self-pay | Admitting: Internal Medicine
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
## 2020-03-07 ENCOUNTER — Telehealth: Payer: Self-pay | Admitting: Internal Medicine

## 2020-03-07 ENCOUNTER — Ambulatory Visit: Payer: Self-pay | Admitting: Internal Medicine

## 2020-03-07 NOTE — Telephone Encounter (Signed)
Patient called in to inform Dr. Mikey Bussing and Susy Frizzle that he had his xray done and it looked good and they were able to change the wound vac and that went well. He wanted to see if he could postpone his visit to a later date. Spoke with Dr. Mikey Bussing and she said she would like to see patient next week to ensure proper healing and discuss plan for going forward. Rescheduled patient for Monday when both providers are in the office.

## 2020-03-12 ENCOUNTER — Encounter: Payer: Self-pay | Admitting: Internal Medicine

## 2020-03-12 ENCOUNTER — Ambulatory Visit (INDEPENDENT_AMBULATORY_CARE_PROVIDER_SITE_OTHER): Payer: Self-pay | Admitting: Internal Medicine

## 2020-03-12 ENCOUNTER — Other Ambulatory Visit: Payer: Self-pay

## 2020-03-12 DIAGNOSIS — S81801D Unspecified open wound, right lower leg, subsequent encounter: Secondary | ICD-10-CM
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
## 2020-03-13 ENCOUNTER — Telehealth: Payer: Self-pay | Admitting: Plastic Surgery

## 2020-03-13 NOTE — Telephone Encounter (Signed)
Patient called to advise that his wound vac is supposed to be 125 but it keeps going up to 150-175  Range. Please call to advise.

## 2020-03-13 NOTE — Telephone Encounter (Signed)
Called patient, LMVM to call the [facility] customer service: [phone]

## 2020-03-14 ENCOUNTER — Telehealth: Payer: Self-pay

## 2020-03-14 NOTE — Telephone Encounter (Signed)
Faxed [employer] order for: Replicare and Adaptic

## 2020-03-21 ENCOUNTER — Telehealth: Payer: Self-pay | Admitting: Plastic Surgery

## 2020-03-21 ENCOUNTER — Telehealth: Payer: Self-pay

## 2020-03-21 NOTE — Telephone Encounter (Signed)
Jose Wagner from [facility] called to get wound measurements for dates 03/08/20-present. Please call [phone] ext [phone] to advise.

## 2020-03-21 NOTE — Telephone Encounter (Signed)
Faxed requested info to [facility]- re: pt's wound measurements

## 2020-03-22 NOTE — Telephone Encounter (Signed)
Per [place]:Faxed requested info to [facility]- re: pt's wound measurements

## 2020-03-28 ENCOUNTER — Ambulatory Visit: Payer: Self-pay | Admitting: Internal Medicine

## 2020-03-29 ENCOUNTER — Ambulatory Visit (INDEPENDENT_AMBULATORY_CARE_PROVIDER_SITE_OTHER): Payer: Self-pay | Admitting: Internal Medicine

## 2020-03-29 ENCOUNTER — Encounter: Payer: Self-pay | Admitting: Internal Medicine

## 2020-03-29 ENCOUNTER — Other Ambulatory Visit: Payer: Self-pay

## 2020-03-29 DIAGNOSIS — S81801D Unspecified open wound, right lower leg, subsequent encounter: Secondary | ICD-10-CM

## 2020-03-29 MED ORDER — MUPIROCIN 2 % EX OINT: 1.0000 "application " | TOPICAL_OINTMENT | Freq: Every day | CUTANEOUS | 0 refills | Status: DC

## 2020-03-29 NOTE — Patient Instructions (Signed)
  Jose Wagner It was a pleasure seeing you today.  Please follow the instructions below for your wound care  For your right ankle 1) clean with normal saline 2) Collagen dressing changes daily 3) followed by non stick pad 4) wrap with gauze 5) cover with ace bandage   Please follow up with me in 1 weeks.  Call us at [phone] with any questions or concerns  For your right leg:  Continue to do the wound vac changes 2 times per week.  [employer] is a medical supply company.  We have sent them an order for your supplies.  Please contact them if you do not receive your supplies in the next 72hrs.  Their number is [phone] and website is [email or web address]
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
## 2020-03-30 ENCOUNTER — Telehealth: Payer: Self-pay | Admitting: *Deleted

## 2020-03-30 NOTE — Telephone Encounter (Signed)
Faxed orders to [facility] for supplies for the patient.  Confirmation received and copy scanned into the chart.//AB/CMA

## 2020-04-03 ENCOUNTER — Telehealth: Payer: Self-pay | Admitting: Surgical

## 2020-04-03 MED ORDER — TRIAMCINOLONE ACETONIDE 0.025 % EX OINT: 1.0000 "application " | TOPICAL_OINTMENT | Freq: Two times a day (BID) | CUTANEOUS | 0 refills | Status: DC

## 2020-04-03 NOTE — Telephone Encounter (Signed)
Patient called noting he has been exposed to another individual with poison ivy.  He reports a very sensitive reaction to poison ivy/poison oak in the past.  He reports that he has even contracted it before from another person.  He reports no medication changes, new foods any other changes.  Discussed with patient that he should be evaluated either by a PCP or urgent care.  I am happy to call him in a steroid cream, if he is unable to see any other providers today he is welcome to come see Dr. Mikey Bussing.  Patient understood and agreed.  He reported he was otherwise feeling well.

## 2020-04-03 NOTE — Telephone Encounter (Signed)
Coming Thursday for eval of rash, steroid rx sent to pharmacy.

## 2020-04-03 NOTE — Telephone Encounter (Signed)
Patient called to say that he developed a rash around his wound on his ankle and it's now spread all over his body. He said it appears to be poison oak/ivy. Please call to advise what patient should do or if he should go somewhere to get a shot for the rash.

## 2020-04-04 ENCOUNTER — Other Ambulatory Visit: Payer: Self-pay | Admitting: Surgical

## 2020-04-04 ENCOUNTER — Telehealth: Payer: Self-pay | Admitting: Surgical

## 2020-04-04 MED ORDER — TRIAMCINOLONE ACETONIDE 0.025 % EX OINT: 1.0000 "application " | TOPICAL_OINTMENT | Freq: Two times a day (BID) | CUTANEOUS | 3 refills | Status: DC

## 2020-04-04 NOTE — Telephone Encounter (Signed)
Discussed with patient that I will reorder some triamcinolone steroid cream for him.  He is coming in tomorrow to see Dr. Mikey Bussing, she will further evaluate the rash and determine if oral steroids will be necessary.  With oral steroids there will be the downside of delayed healing.  Discussed this with patient and he understood, call with any further questions or concerns.

## 2020-04-04 NOTE — Telephone Encounter (Signed)
Patient called back to advise that a steroid cream was called in for him yesterday by [facility] and because the rash is all over his body, he went through the tubes fairly quickly. He wanted to know if he could get refills on this as well as something in pill form that could help. He said Dr. Mikey Bussing may also be able to help with this. Please call patient to advise.

## 2020-04-05 ENCOUNTER — Other Ambulatory Visit: Payer: Self-pay

## 2020-04-05 ENCOUNTER — Encounter: Payer: Self-pay | Admitting: Internal Medicine

## 2020-04-05 ENCOUNTER — Ambulatory Visit (INDEPENDENT_AMBULATORY_CARE_PROVIDER_SITE_OTHER): Payer: Self-pay | Admitting: Internal Medicine

## 2020-04-05 ENCOUNTER — Telehealth: Payer: Self-pay | Admitting: *Deleted

## 2020-04-05 DIAGNOSIS — L237 Allergic contact dermatitis due to plants, except food: Secondary | ICD-10-CM

## 2020-04-05 DIAGNOSIS — S81801D Unspecified open wound, right lower leg, subsequent encounter: Secondary | ICD-10-CM

## 2020-04-05 MED ORDER — PREDNISONE 10 MG PO TABS: 10.0000 mg | ORAL_TABLET | Freq: Every day | ORAL | 0 refills | Status: DC

## 2020-04-05 NOTE — Patient Instructions (Addendum)
Niall Illes It was a pleasure seeing you today.  Please follow the instructions below for your wound care  For your right ankle 1) clean with normal saline 2) Collagen dressing changes daily 3) followed by non stick pad 4) wrap with gauze 5) cover with ace bandage   Please follow up with me in 1 weeks.  Call us at [phone] with any questions or concerns  For your right leg:  Continue to do the wound vac changes 2 times per week.  [facility] is a medical supply company.  We have sent them an order for your supplies.  Please contact them if you do not receive your supplies in the next 72hrs.  Their number is [phone] and website is [email or web address]  For your poison ivy rash please take prednisone for the next 9 days.  For the first 3 days please take 40 mg daily, followed by 20 mg daily for another 3 days and last 10 mg for 3 days.
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
## 2020-04-05 NOTE — Telephone Encounter (Signed)
Faxed recent office notes to Dr. Casim[facility].  Confirmation received.//AB/CMA

## 2020-04-19 ENCOUNTER — Encounter: Payer: Self-pay | Admitting: Internal Medicine

## 2020-04-19 ENCOUNTER — Other Ambulatory Visit: Payer: Self-pay

## 2020-04-19 ENCOUNTER — Ambulatory Visit (INDEPENDENT_AMBULATORY_CARE_PROVIDER_SITE_OTHER): Payer: Self-pay | Admitting: Internal Medicine

## 2020-04-19 VITALS — BP 129/91 | HR 73 | Temp 97.8°F

## 2020-04-19 DIAGNOSIS — S81801D Unspecified open wound, right lower leg, subsequent encounter: Secondary | ICD-10-CM

## 2020-04-19 DIAGNOSIS — L237 Allergic contact dermatitis due to plants, except food: Secondary | ICD-10-CM
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
## 2020-04-25 ENCOUNTER — Telehealth: Payer: Self-pay

## 2020-04-25 NOTE — Telephone Encounter (Signed)
Natalia Leatherwood from [facility] called requesting wound measurements for patient from 04/08/2020 - 04/22/2020. Fax: [phone]

## 2020-04-26 NOTE — Telephone Encounter (Signed)
Wound measurement for (04/08/20-04/22/20) sent to [facility].  Confirmation received.//AB/CMA   Wound measurement: (04/19/20)-(13.5x6.0x0.5cm)

## 2020-05-02 ENCOUNTER — Ambulatory Visit (INDEPENDENT_AMBULATORY_CARE_PROVIDER_SITE_OTHER): Payer: Self-pay | Admitting: Internal Medicine

## 2020-05-02 ENCOUNTER — Other Ambulatory Visit: Payer: Self-pay

## 2020-05-02 ENCOUNTER — Encounter: Payer: Self-pay | Admitting: Internal Medicine

## 2020-05-02 VITALS — BP 127/80 | HR 81 | Temp 97.1°F

## 2020-05-02 DIAGNOSIS — S81801D Unspecified open wound, right lower leg, subsequent encounter: Secondary | ICD-10-CM

## 2020-05-02 MED ORDER — DOXYCYCLINE HYCLATE 100 MG PO TABS: 100.0000 mg | ORAL_TABLET | Freq: Two times a day (BID) | ORAL | 0 refills | Status: AC
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
## 2020-05-03 ENCOUNTER — Ambulatory Visit: Payer: Self-pay | Admitting: Internal Medicine

## 2020-05-03 ENCOUNTER — Ambulatory Visit (INDEPENDENT_AMBULATORY_CARE_PROVIDER_SITE_OTHER): Payer: Self-pay | Admitting: Plastic Surgery

## 2020-05-03 ENCOUNTER — Encounter: Payer: Self-pay | Admitting: Plastic Surgery

## 2020-05-03 ENCOUNTER — Telehealth: Payer: Self-pay

## 2020-05-03 VITALS — BP 142/86 | HR 94 | Temp 97.2°F

## 2020-05-03 DIAGNOSIS — S82221C Displaced transverse fracture of shaft of right tibia, initial encounter for open fracture type IIIA, IIIB, or IIIC: Secondary | ICD-10-CM

## 2020-05-03 DIAGNOSIS — S91001A Unspecified open wound, right ankle, initial encounter: Secondary | ICD-10-CM

## 2020-05-03 NOTE — Telephone Encounter (Signed)
Returned patients call. Advised him that his white count, c-reative and sedrate were all in normal range. Asked [facility] if he had any questions. Patient was okay, understood and agreed.

## 2020-05-03 NOTE — Telephone Encounter (Signed)
Patient called wanting to know if his lab work results were ready. He obtained lab work yesterday at [facility] in [place].
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
## 2020-05-04 ENCOUNTER — Encounter (HOSPITAL_BASED_OUTPATIENT_CLINIC_OR_DEPARTMENT_OTHER): Payer: Self-pay | Admitting: Plastic Surgery

## 2020-05-04 ENCOUNTER — Other Ambulatory Visit: Payer: Self-pay

## 2020-05-09 ENCOUNTER — Telehealth: Payer: Self-pay | Admitting: Plastic Surgery

## 2020-05-09 NOTE — Telephone Encounter (Signed)
Spoke with patient.  Dr. Ulice Bold will leave another sheet of Tritrec at the front desk for him to pick up.  This can be left on for 1 week and change the outer dressing daily as before. His Sx is scheduled for July 12.

## 2020-05-09 NOTE — Telephone Encounter (Signed)
Patient called to find out whether he should come back to pick up some of the dressing that Dr. Ulice Bold had given him that was to be used for 7 days or put the wound vac back on. Please call the patient back to advise what he should do about wound care. Sx planned for 05/14/20.

## 2020-05-10 ENCOUNTER — Telehealth: Payer: Self-pay | Admitting: Plastic Surgery

## 2020-05-10 NOTE — Telephone Encounter (Signed)
Called patient to offer sooner surgery appointment. Patient declined due to transportation.

## 2020-05-11 NOTE — Progress Notes (Signed)
Left message for patient re need to go for covid test today.  Address and time given for testing site.  Dr. Kittie Plater office also aware.

## 2020-05-12 ENCOUNTER — Other Ambulatory Visit (HOSPITAL_COMMUNITY)
Admission: RE | Admit: 2020-05-12 | Discharge: 2020-05-12 | Disposition: A | Discharge status: Home / Self Care | Payer: 59 | Source: Ambulatory Visit | Attending: Plastic Surgery | Admitting: Plastic Surgery

## 2020-05-12 DIAGNOSIS — Z01812 Encounter for preprocedural laboratory examination: Secondary | ICD-10-CM | POA: Insufficient documentation

## 2020-05-12 DIAGNOSIS — Z20822 Contact with and (suspected) exposure to covid-19: Secondary | ICD-10-CM | POA: Diagnosis not present

## 2020-05-12 LAB — SARS CORONAVIRUS 2 (TAT 6-24 HRS): SARS Coronavirus 2: NEGATIVE

## 2020-05-14 ENCOUNTER — Ambulatory Visit (HOSPITAL_BASED_OUTPATIENT_CLINIC_OR_DEPARTMENT_OTHER)
Admission: RE | Admit: 2020-05-14 | Discharge: 2020-05-14 | Disposition: A | Discharge status: Home / Self Care | Payer: 59 | Attending: Plastic Surgery | Admitting: Plastic Surgery

## 2020-05-14 ENCOUNTER — Ambulatory Visit (HOSPITAL_BASED_OUTPATIENT_CLINIC_OR_DEPARTMENT_OTHER): Payer: 59 | Admitting: Anesthesiology

## 2020-05-14 ENCOUNTER — Encounter (HOSPITAL_BASED_OUTPATIENT_CLINIC_OR_DEPARTMENT_OTHER)
Admission: RE | Disposition: A | Discharge status: Home / Self Care | Payer: Self-pay | Source: Home / Self Care | Attending: Plastic Surgery

## 2020-05-14 ENCOUNTER — Encounter (HOSPITAL_BASED_OUTPATIENT_CLINIC_OR_DEPARTMENT_OTHER): Payer: Self-pay | Admitting: Plastic Surgery

## 2020-05-14 ENCOUNTER — Other Ambulatory Visit: Payer: Self-pay

## 2020-05-14 DIAGNOSIS — S91001A Unspecified open wound, right ankle, initial encounter: Secondary | ICD-10-CM | POA: Insufficient documentation

## 2020-05-14 DIAGNOSIS — S82221C Displaced transverse fracture of shaft of right tibia, initial encounter for open fracture type IIIA, IIIB, or IIIC: Secondary | ICD-10-CM

## 2020-05-14 DIAGNOSIS — X58XXXA Exposure to other specified factors, initial encounter: Secondary | ICD-10-CM | POA: Diagnosis not present

## 2020-05-14 HISTORY — PX: APPLICATION OF WOUND VAC: SHX5189

## 2020-05-14 HISTORY — PX: SKIN SPLIT GRAFT: SHX444

## 2020-05-14 SURGERY — APPLICATION, GRAFT, SKIN, SPLIT-THICKNESS
Anesthesia: General | Site: Leg Lower | Laterality: Right

## 2020-05-14 MED ORDER — ACETAMINOPHEN 325 MG RE SUPP: 650.0000 mg | RECTAL | Status: DC | PRN

## 2020-05-14 MED ORDER — ACETAMINOPHEN 325 MG PO TABS: 650.0000 mg | ORAL_TABLET | ORAL | Status: DC | PRN

## 2020-05-14 MED ORDER — SODIUM CHLORIDE 0.9 % IV SOLN
INTRAVENOUS | Status: DC | PRN
  Administered 2020-05-14: 500 mL

## 2020-05-14 MED ORDER — DEXAMETHASONE SODIUM PHOSPHATE 4 MG/ML IJ SOLN
INTRAMUSCULAR | Status: DC | PRN
  Administered 2020-05-14: 5 mg via INTRAVENOUS

## 2020-05-14 MED ORDER — MIDAZOLAM HCL 2 MG/2ML IJ SOLN
INTRAMUSCULAR | Status: AC
  Filled 2020-05-14: qty 2

## 2020-05-14 MED ORDER — FENTANYL CITRATE (PF) 100 MCG/2ML IJ SOLN
INTRAMUSCULAR | Status: DC | PRN
  Administered 2020-05-14: 100 ug via INTRAVENOUS

## 2020-05-14 MED ORDER — SODIUM CHLORIDE 0.9 % IV SOLN
INTRAVENOUS | Status: AC
  Filled 2020-05-14: qty 500000

## 2020-05-14 MED ORDER — BUPIVACAINE HCL (PF) 0.5 % IJ SOLN
INTRAMUSCULAR | Status: AC
  Filled 2020-05-14: qty 30

## 2020-05-14 MED ORDER — MIDAZOLAM HCL 5 MG/5ML IJ SOLN
INTRAMUSCULAR | Status: DC | PRN
  Administered 2020-05-14: 2 mg via INTRAVENOUS

## 2020-05-14 MED ORDER — SODIUM CHLORIDE 0.9 % IV SOLN: 250.0000 mL | INTRAVENOUS | Status: DC | PRN

## 2020-05-14 MED ORDER — CEFAZOLIN SODIUM-DEXTROSE 2-4 GM/100ML-% IV SOLN
INTRAVENOUS | Status: AC
  Filled 2020-05-14: qty 100

## 2020-05-14 MED ORDER — CEFAZOLIN SODIUM-DEXTROSE 2-4 GM/100ML-% IV SOLN
2.0000 g | INTRAVENOUS | Status: AC
  Administered 2020-05-14: 2 g via INTRAVENOUS

## 2020-05-14 MED ORDER — DIPHENHYDRAMINE HCL 50 MG/ML IJ SOLN
INTRAMUSCULAR | Status: AC
  Filled 2020-05-14: qty 1

## 2020-05-14 MED ORDER — MORPHINE SULFATE (PF) 4 MG/ML IV SOLN: 2.0000 mg | INTRAVENOUS | Status: DC | PRN

## 2020-05-14 MED ORDER — SUCCINYLCHOLINE CHLORIDE 200 MG/10ML IV SOSY
PREFILLED_SYRINGE | INTRAVENOUS | Status: AC
  Filled 2020-05-14: qty 10

## 2020-05-14 MED ORDER — DIPHENHYDRAMINE HCL 50 MG/ML IJ SOLN
INTRAMUSCULAR | Status: DC | PRN
  Administered 2020-05-14: .25 mg via INTRAVENOUS

## 2020-05-14 MED ORDER — LIDOCAINE HCL (PF) 1 % IJ SOLN
INTRAMUSCULAR | Status: AC
  Filled 2020-05-14: qty 30

## 2020-05-14 MED ORDER — SODIUM CHLORIDE 0.9% FLUSH: 3.0000 mL | INTRAVENOUS | Status: DC | PRN

## 2020-05-14 MED ORDER — DEXMEDETOMIDINE HCL 200 MCG/2ML IV SOLN
INTRAVENOUS | Status: DC | PRN
  Administered 2020-05-14: 30 ug via INTRAVENOUS

## 2020-05-14 MED ORDER — LIDOCAINE-EPINEPHRINE 1 %-1:100000 IJ SOLN
INTRAMUSCULAR | Status: DC | PRN
  Administered 2020-05-14: 10 mL

## 2020-05-14 MED ORDER — OXYCODONE HCL 5 MG PO TABS: 5.0000 mg | ORAL_TABLET | ORAL | Status: DC | PRN

## 2020-05-14 MED ORDER — LIDOCAINE 2% (20 MG/ML) 5 ML SYRINGE
INTRAMUSCULAR | Status: AC
  Filled 2020-05-14: qty 5

## 2020-05-14 MED ORDER — LACTATED RINGERS IV SOLN: INTRAVENOUS | Status: DC

## 2020-05-14 MED ORDER — BACITRACIN-NEOMYCIN-POLYMYXIN OINTMENT TUBE
TOPICAL_OINTMENT | CUTANEOUS | Status: AC
  Filled 2020-05-14: qty 14.17

## 2020-05-14 MED ORDER — PROPOFOL 10 MG/ML IV BOLUS
INTRAVENOUS | Status: DC | PRN
  Administered 2020-05-14: 200 mg via INTRAVENOUS

## 2020-05-14 MED ORDER — CHLORHEXIDINE GLUCONATE CLOTH 2 % EX PADS: 6.0000 | MEDICATED_PAD | Freq: Once | CUTANEOUS | Status: DC

## 2020-05-14 MED ORDER — EPHEDRINE 5 MG/ML INJ
INTRAVENOUS | Status: AC
  Filled 2020-05-14: qty 10

## 2020-05-14 MED ORDER — BUPIVACAINE HCL (PF) 0.25 % IJ SOLN
INTRAMUSCULAR | Status: DC | PRN
  Administered 2020-05-14: 10 mL

## 2020-05-14 MED ORDER — DEXAMETHASONE SODIUM PHOSPHATE 10 MG/ML IJ SOLN
INTRAMUSCULAR | Status: AC
  Filled 2020-05-14: qty 1

## 2020-05-14 MED ORDER — PROPOFOL 500 MG/50ML IV EMUL
INTRAVENOUS | Status: AC
  Filled 2020-05-14: qty 50

## 2020-05-14 MED ORDER — SODIUM CHLORIDE 0.9% FLUSH: 3.0000 mL | Freq: Two times a day (BID) | INTRAVENOUS | Status: DC

## 2020-05-14 MED ORDER — BUPIVACAINE HCL (PF) 0.25 % IJ SOLN
INTRAMUSCULAR | Status: AC
  Filled 2020-05-14: qty 30

## 2020-05-14 MED ORDER — LIDOCAINE HCL (CARDIAC) PF 100 MG/5ML IV SOSY
PREFILLED_SYRINGE | INTRAVENOUS | Status: DC | PRN
  Administered 2020-05-14: 80 mg via INTRAVENOUS

## 2020-05-14 MED ORDER — ONDANSETRON HCL 4 MG/2ML IJ SOLN
INTRAMUSCULAR | Status: AC
  Filled 2020-05-14: qty 2

## 2020-05-14 MED ORDER — FENTANYL CITRATE (PF) 100 MCG/2ML IJ SOLN
INTRAMUSCULAR | Status: AC
  Filled 2020-05-14: qty 2

## 2020-05-14 MED ORDER — LIDOCAINE-EPINEPHRINE 1 %-1:100000 IJ SOLN
INTRAMUSCULAR | Status: AC
  Filled 2020-05-14: qty 1

## 2020-05-14 MED ORDER — PHENYLEPHRINE 40 MCG/ML (10ML) SYRINGE FOR IV PUSH (FOR BLOOD PRESSURE SUPPORT)
PREFILLED_SYRINGE | INTRAVENOUS | Status: AC
  Filled 2020-05-14: qty 10

## 2020-05-14 SURGICAL SUPPLY — 89 items
BAG DECANTER FOR FLEXI CONT (MISCELLANEOUS) IMPLANT
BENZOIN TINCTURE PRP APPL 2/3 (GAUZE/BANDAGES/DRESSINGS) IMPLANT
BLADE CLIPPER SURG (BLADE) IMPLANT
BLADE DERMATOME SS (BLADE) ×3 IMPLANT
BLADE HEX COATED 2.75 (ELECTRODE) IMPLANT
BLADE MINI RND TIP GREEN BEAV (BLADE) IMPLANT
BLADE SURG 10 STRL SS (BLADE) ×3 IMPLANT
BLADE SURG 15 STRL LF DISP TIS (BLADE) ×1 IMPLANT
BLADE SURG 15 STRL SS (BLADE) ×2
BNDG COHESIVE 4X5 TAN STRL (GAUZE/BANDAGES/DRESSINGS) IMPLANT
BNDG ELASTIC 3X5.8 VLCR STR LF (GAUZE/BANDAGES/DRESSINGS) IMPLANT
BNDG ELASTIC 4X5.8 VLCR STR LF (GAUZE/BANDAGES/DRESSINGS) ×6 IMPLANT
BNDG ELASTIC 6X5.8 VLCR STR LF (GAUZE/BANDAGES/DRESSINGS) IMPLANT
BNDG GAUZE ELAST 4 BULKY (GAUZE/BANDAGES/DRESSINGS) ×6 IMPLANT
CANISTER SUCT 1200ML W/VALVE (MISCELLANEOUS) IMPLANT
CORD BIPOLAR FORCEPS 12FT (ELECTRODE) IMPLANT
COTTONBALL LRG STERILE PKG (GAUZE/BANDAGES/DRESSINGS) IMPLANT
COVER BACK TABLE 60X90IN (DRAPES) ×3 IMPLANT
COVER MAYO STAND STRL (DRAPES) ×3 IMPLANT
COVER WAND RF STERILE (DRAPES) IMPLANT
DECANTER SPIKE VIAL GLASS SM (MISCELLANEOUS) IMPLANT
DERMABOND ADVANCED (GAUZE/BANDAGES/DRESSINGS)
DERMABOND ADVANCED .7 DNX12 (GAUZE/BANDAGES/DRESSINGS) IMPLANT
DERMACARRIERS GRAFT 1 TO 1.5 (DISPOSABLE)
DRAIN PENROSE 1/2X12 LTX STRL (WOUND CARE) IMPLANT
DRAPE INCISE IOBAN 66X45 STRL (DRAPES) ×3 IMPLANT
DRAPE U-SHAPE 76X120 STRL (DRAPES) ×3 IMPLANT
DRSG ADAPTIC 3X8 NADH LF (GAUZE/BANDAGES/DRESSINGS) ×3 IMPLANT
DRSG EMULSION OIL 3X3 NADH (GAUZE/BANDAGES/DRESSINGS) IMPLANT
DRSG HYDROCOLLOID 4X4 (GAUZE/BANDAGES/DRESSINGS) IMPLANT
DRSG OPSITE 6X11 MED (GAUZE/BANDAGES/DRESSINGS) IMPLANT
DRSG PAD ABDOMINAL 8X10 ST (GAUZE/BANDAGES/DRESSINGS) ×3 IMPLANT
DRSG TEGADERM 4X10 (GAUZE/BANDAGES/DRESSINGS) IMPLANT
ELECT NEEDLE TIP 2.8 STRL (NEEDLE) IMPLANT
ELECT REM PT RETURN 9FT ADLT (ELECTROSURGICAL) ×3
ELECTRODE REM PT RTRN 9FT ADLT (ELECTROSURGICAL) ×1 IMPLANT
GAUZE SPONGE 4X4 12PLY STRL (GAUZE/BANDAGES/DRESSINGS) ×3 IMPLANT
GAUZE SPONGE 4X4 12PLY STRL LF (GAUZE/BANDAGES/DRESSINGS) IMPLANT
GAUZE XEROFORM 5X9 LF (GAUZE/BANDAGES/DRESSINGS) ×3 IMPLANT
GLOVE BIO SURGEON STRL SZ 6.5 (GLOVE) ×4 IMPLANT
GLOVE BIO SURGEON STRL SZ7 (GLOVE) ×3 IMPLANT
GLOVE BIO SURGEONS STRL SZ 6.5 (GLOVE) ×2
GOWN STRL REUS W/ TWL LRG LVL3 (GOWN DISPOSABLE) ×2 IMPLANT
GOWN STRL REUS W/ TWL XL LVL3 (GOWN DISPOSABLE) ×1 IMPLANT
GOWN STRL REUS W/TWL LRG LVL3 (GOWN DISPOSABLE) ×4
GOWN STRL REUS W/TWL XL LVL3 (GOWN DISPOSABLE) ×2
GRAFT DERMACARRIERS 1 TO 1.5 (DISPOSABLE) IMPLANT
NEEDLE HYPO 25GX1X1/2 BEV (NEEDLE) ×3 IMPLANT
NEEDLE HYPO 25X1 1.5 SAFETY (NEEDLE) ×6 IMPLANT
NEEDLE PRECISIONGLIDE 27X1.5 (NEEDLE) IMPLANT
NS IRRIG 1000ML POUR BTL (IV SOLUTION) ×3 IMPLANT
PACK BASIN DAY SURGERY FS (CUSTOM PROCEDURE TRAY) ×3 IMPLANT
PADDING CAST ABS 3INX4YD NS (CAST SUPPLIES)
PADDING CAST ABS 4INX4YD NS (CAST SUPPLIES)
PADDING CAST ABS COTTON 3X4 (CAST SUPPLIES) IMPLANT
PADDING CAST ABS COTTON 4X4 ST (CAST SUPPLIES) IMPLANT
PENCIL SMOKE EVACUATOR (MISCELLANEOUS) IMPLANT
SHEET MEDIUM DRAPE 40X70 STRL (DRAPES) ×3 IMPLANT
SLEEVE SCD COMPRESS KNEE MED (MISCELLANEOUS) IMPLANT
SPLINT FIBERGLASS 3X35 (CAST SUPPLIES) IMPLANT
SPLINT FIBERGLASS 4X30 (CAST SUPPLIES) ×3 IMPLANT
SPLINT PLASTER CAST XFAST 3X15 (CAST SUPPLIES) IMPLANT
SPLINT PLASTER XTRA FASTSET 3X (CAST SUPPLIES)
SPONGE LAP 18X18 RF (DISPOSABLE) ×3 IMPLANT
STAPLER VISISTAT 35W (STAPLE) IMPLANT
STOCKINETTE 4X48 STRL (DRAPES) IMPLANT
STOCKINETTE 6  STRL (DRAPES) ×2
STOCKINETTE 6 STRL (DRAPES) ×1 IMPLANT
STOCKINETTE IMPERVIOUS LG (DRAPES) IMPLANT
SUCTION FRAZIER HANDLE 10FR (MISCELLANEOUS)
SUCTION TUBE FRAZIER 10FR DISP (MISCELLANEOUS) IMPLANT
SURGILUBE 2OZ TUBE FLIPTOP (MISCELLANEOUS) IMPLANT
SUT MON AB 5-0 PS2 18 (SUTURE) IMPLANT
SUT SILK 3 0 PS 1 (SUTURE) IMPLANT
SUT SILK 3 0 SH CR/8 (SUTURE) IMPLANT
SUT SILK 4 0 PS 2 (SUTURE) IMPLANT
SUT VIC AB 3-0 FS2 27 (SUTURE) IMPLANT
SUT VIC AB 5-0 P-3 18X BRD (SUTURE) IMPLANT
SUT VIC AB 5-0 P3 18 (SUTURE)
SUT VIC AB 5-0 PS2 18 (SUTURE) ×12 IMPLANT
SUT VICRYL 4-0 PS2 18IN ABS (SUTURE) IMPLANT
SYR BULB IRRIG 60ML STRL (SYRINGE) ×3 IMPLANT
SYR CONTROL 10ML LL (SYRINGE) ×6 IMPLANT
TOWEL GREEN STERILE FF (TOWEL DISPOSABLE) ×3 IMPLANT
TRAY DSU PREP LF (CUSTOM PROCEDURE TRAY) ×3 IMPLANT
TUBE CONNECTING 20'X1/4 (TUBING)
TUBE CONNECTING 20X1/4 (TUBING) IMPLANT
UNDERPAD 30X36 HEAVY ABSORB (UNDERPADS AND DIAPERS) ×3 IMPLANT
YANKAUER SUCT BULB TIP NO VENT (SUCTIONS) ×3 IMPLANT

## 2020-05-14 NOTE — Op Note (Signed)
DATE OF OPERATION: 05/14/2020  LOCATION: [facility]  PREOPERATIVE DIAGNOSIS: Right leg traumatic wound  POSTOPERATIVE DIAGNOSIS: Same  PROCEDURE: Split thickness skin graft to right leg traumatic wound 6 x 12 cm, placement of VAC  SURGEON: Hadyn Blanck Sanger Makenlee Mckeag, DO  ASSISTANT: Keenan Bachelor, PA  EBL: 2 cc  CONDITION: Stable  COMPLICATIONS: None  INDICATION: The patient, Jose Wagner, is a 24 y.o. male born on [birth year], is here for treatment right lower leg wound.   PROCEDURE DETAILS:  The patient was seen prior to surgery and marked.  The IV antibiotics were given. The patient was taken to the operating room and given a general anesthetic. A standard time out was performed and all information was confirmed by those in the room. SCD was placed on the left leg.   The right leg and thigh were prepped with Betadine and draped.  Local with epinephrine was injected into the right upper thigh for an area of 6 x 12.  The dermatome was set at 10/999 th inch.  The graft was obtained.  The thigh was dressed with Xeroform and an ABD. The right lower leg wound 6 x 12 cm was debrided with a curette and irrigated with antibiotic solution.  Pressure was held for hemostasis.  The skin graft was applied and secured in place with 5-0 Vicryl.  The Adaptic was placed over it.  Restore was placed around the periwound area to protect the skin.  The VAC was then applied with Ioban.  There was an excellent seal.  The patient was placed in a plaster splint to prevent movement and secure the graft in place. The patient was allowed to wake up and taken to recovery room in stable condition at the end of the case. The family was notified at the end of the case.   The advanced practice practitioner (APP) assisted throughout the case.  The APP was essential in retraction and counter traction when needed to make the case progress smoothly.  This retraction and assistance made it possible to see the  tissue plans for the procedure.  The assistance was needed for blood control, tissue re-approximation and assisted with closure of the incision site.

## 2020-05-14 NOTE — Anesthesia Preprocedure Evaluation (Signed)
Anesthesia Evaluation  Patient identified by MRN, date of birth, ID band Patient awake    Reviewed: Allergy & Precautions, H&P , NPO status , Patient's Chart, lab work & pertinent test results  Airway Mallampati: II   Neck ROM: full    Dental   Pulmonary neg pulmonary ROS,    breath sounds clear to auscultation       Cardiovascular negative cardio ROS   Rhythm:regular Rate:Normal     Neuro/Psych    GI/Hepatic   Endo/Other    Renal/GU      Musculoskeletal   Abdominal   Peds  Hematology   Anesthesia Other Findings   Reproductive/Obstetrics                             Anesthesia Physical  Anesthesia Plan  ASA: I  Anesthesia Plan: General   Post-op Pain Management:    Induction: Intravenous  PONV Risk Score and Plan: 2 and Ondansetron, Dexamethasone, Midazolam and Treatment may vary due to age or medical condition  Airway Management Planned: LMA  Additional Equipment:   Intra-op Plan:   Post-operative Plan: Extubation in OR  Informed Consent: I have reviewed the patients History and Physical, chart, labs and discussed the procedure including the risks, benefits and alternatives for the proposed anesthesia with the patient or authorized representative who has indicated his/her understanding and acceptance.       Plan Discussed with: CRNA, Anesthesiologist and Surgeon  Anesthesia Plan Comments:         Anesthesia Quick Evaluation

## 2020-05-14 NOTE — Transfer of Care (Signed)
Immediate Anesthesia Transfer of Care Note  Patient: Jose Wagner  Procedure(s) Performed: SKIN GRAFT SPLIT THICKNESS (Right Leg Lower) APPLICATION OF WOUND VAC (Right Leg Lower)  Patient Location: PACU  Anesthesia Type:General  Level of Consciousness: sedated  Airway & Oxygen Therapy: Patient Spontanous Breathing and Patient connected to face mask oxygen  Post-op Assessment: Report given to RN and Post -op Vital signs reviewed and stable  Post vital signs: Reviewed and stable  Last Vitals:  Vitals Value Taken Time  BP    Temp    Pulse    Resp    SpO2      Last Pain:  Vitals:   05/14/20 1205  TempSrc: Oral  PainSc: 0-No pain      Patients Stated Pain Goal: 5 (05/14/20 1205)  Complications: No complications documented.

## 2020-05-14 NOTE — Anesthesia Procedure Notes (Signed)
Procedure Name: LMA Insertion Date/Time: 05/14/2020 1:34 PM Performed by: Ronnette Hila, CRNA Pre-anesthesia Checklist: Patient identified, Emergency Drugs available, Suction available and Patient being monitored Patient Re-evaluated:Patient Re-evaluated prior to induction Oxygen Delivery Method: Circle system utilized Preoxygenation: Pre-oxygenation with 100% oxygen Induction Type: IV induction Ventilation: Mask ventilation without difficulty LMA: LMA inserted LMA Size: 5.0 Number of attempts: 1 Airway Equipment and Method: Bite block Placement Confirmation: positive ETCO2 Tube secured with: Tape Dental Injury: Teeth and Oropharynx as per pre-operative assessment

## 2020-05-14 NOTE — Anesthesia Postprocedure Evaluation (Signed)
Anesthesia Post Note  Patient: Jose Wagner  Procedure(s) Performed: SKIN GRAFT SPLIT THICKNESS (Right Leg Lower) APPLICATION OF WOUND VAC (Right Leg Lower)     Patient location during evaluation: PACU Anesthesia Type: General Level of consciousness: awake and alert Pain management: pain level controlled Vital Signs Assessment: post-procedure vital signs reviewed and stable Respiratory status: spontaneous breathing, nonlabored ventilation, respiratory function stable and patient connected to nasal cannula oxygen Cardiovascular status: blood pressure returned to baseline and stable Postop Assessment: no apparent nausea or vomiting Anesthetic complications: no   No complications documented.  Last Vitals:  Vitals:   05/14/20 1500 05/14/20 1514  BP: (!) 103/44 (!) 115/50  Pulse: 63 68  Resp: (!) 23 18  Temp:  (!) 36.4 C  SpO2: 100% 100%    Last Pain:  Vitals:   05/14/20 1514  TempSrc:   PainSc: 0-No pain                 Reda Gettis

## 2020-05-14 NOTE — Progress Notes (Signed)
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

## 2020-05-14 NOTE — Discharge Instructions (Addendum)
Leave wound vac in place for one week. Avoid showering or getting would vac/upper thigh wet. Leave dressing on right upper thigh in place for one week.   Post Anesthesia Home Care Instructions  Activity: Get plenty of rest for the remainder of the day. A responsible individual must stay with you for 24 hours following the procedure.  For the next 24 hours, DO NOT: -Drive a car -Advertising copywriter -Drink alcoholic beverages -Take any medication unless instructed by your physician -Make any legal decisions or sign important papers.  Meals: Start with liquid foods such as gelatin or soup. Progress to regular foods as tolerated. Avoid greasy, spicy, heavy foods. If nausea and/or vomiting occur, drink only clear liquids until the nausea and/or vomiting subsides. Call your physician if vomiting continues.  Special Instructions/Symptoms: Your throat may feel dry or sore from the anesthesia or the breathing tube placed in your throat during surgery. If this causes discomfort, gargle with warm salt water. The discomfort should disappear within 24 hours.  If you had a scopolamine patch placed behind your ear for the management of post- operative nausea and/or vomiting:  1. The medication in the patch is effective for 72 hours, after which it should be removed.  Wrap patch in a tissue and discard in the trash. Wash hands thoroughly with soap and water. 2. You may remove the patch earlier than 72 hours if you experience unpleasant side effects which may include dry mouth, dizziness or visual disturbances. 3. Avoid touching the patch. Wash your hands with soap and water after contact with the patch.    Please DO NOT REMOVE the right lower leg dressing.  This will disrupt the skin graft.  Keep VAC in place and do not disconnect. The thigh dressing:  The outer dressing can be changed daily or as needed if soiled.  Keep the yellow dressing in place.
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
## 2020-05-15 ENCOUNTER — Encounter (HOSPITAL_BASED_OUTPATIENT_CLINIC_OR_DEPARTMENT_OTHER): Payer: Self-pay | Admitting: Plastic Surgery

## 2020-05-15 NOTE — Addendum Note (Signed)
Addendum  created 05/15/20 1300 by Coretta Leisey, Jewel Baize, CRNA   Charge Capture section accepted

## 2020-05-18 ENCOUNTER — Telehealth: Payer: Self-pay | Admitting: Plastic Surgery

## 2020-05-18 NOTE — Telephone Encounter (Signed)
Patient called to ask few questions about the skin graft which was removed from his hip. 1. He was told to keep the bandage on but he wasn't sure for how long. 2. Can the bandage and/or wound get wet? Please call patient back to advise.

## 2020-05-18 NOTE — Telephone Encounter (Signed)
Returned patients call. LMVM do not remove the bandage from the skin graft. Leave on until his next PO follow up 7/20. If for some reason it gets wet, to change the bandage and use a non stick pad.

## 2020-05-22 ENCOUNTER — Ambulatory Visit (INDEPENDENT_AMBULATORY_CARE_PROVIDER_SITE_OTHER): Payer: 59 | Admitting: Plastic Surgery

## 2020-05-22 ENCOUNTER — Other Ambulatory Visit: Payer: Self-pay

## 2020-05-22 ENCOUNTER — Encounter: Payer: Self-pay | Admitting: Plastic Surgery

## 2020-05-22 ENCOUNTER — Telehealth: Payer: Self-pay | Admitting: *Deleted

## 2020-05-22 VITALS — BP 128/74 | HR 73 | Temp 97.8°F

## 2020-05-22 DIAGNOSIS — S91001A Unspecified open wound, right ankle, initial encounter: Secondary | ICD-10-CM

## 2020-05-22 MED ORDER — TRIAMCINOLONE ACETONIDE 0.1 % EX OINT: 1.0000 "application " | TOPICAL_OINTMENT | Freq: Two times a day (BID) | CUTANEOUS | 0 refills | Status: DC

## 2020-05-22 NOTE — Telephone Encounter (Addendum)
Faxed order to [facility] for supplies for the patient.  Confirmation received and copy scanned into the chart.//AB/CMA 

## 2020-05-22 NOTE — Progress Notes (Signed)
   Subjective:    Patient ID: Jose Wagner, male    DOB: 07/16/1996, 24 y.o.   MRN: [id number]  24 year old male here for follow-up on his removed and the skin graft taking well.  There is no sign of infection.  At the donor site he has a bad petechial rash around tape was.  It does not appear to be infected.  He denies any pain.  He has tried steroid cream that does not seem to be helping at the donor site.  The patient removed the Xeroform on the donor site and started using Adaptic.     Review of Systems  Constitutional: Positive for activity change. Negative for appetite change.  Eyes: Negative.   Respiratory: Negative.   Cardiovascular: Negative.   Genitourinary: Negative.   Skin: Positive for color change and wound.  Hematological: Negative.        Objective:   Physical Exam Vitals and nursing note reviewed.  Constitutional:      Appearance: Normal appearance.  Cardiovascular:     Rate and Rhythm: Normal rate.     Pulses: Normal pulses.  Pulmonary:     Effort: Pulmonary effort is normal.  Neurological:     Mental Status: He is alert. Mental status is at baseline.  Psychiatric:        Mood and Affect: Mood normal.        Behavior: Behavior normal.        Assessment & Plan:     ICD-10-CM   1. Open wound of right ankle, initial encounter  S91.001A     Can continue with the Adaptic at the donor site.  I recommend not changing it so that it can heal.  We will call in a stronger steroid cream for the donor site periwound area.  I placed a Xeroform on the skin graft.  Recommend changing that every other day.  He should continue with the splint for at least another week.  I had like.  Lidex sent to pharmacy.

## 2020-05-25 ENCOUNTER — Telehealth: Payer: Self-pay

## 2020-05-25 NOTE — Telephone Encounter (Signed)
Patient called requesting call back regarding dressing changes

## 2020-05-25 NOTE — Telephone Encounter (Signed)
Call to pt re: dressing change questions He is concerned with removing the xeroform layer on the skin graft site. He states that it is adhered & will not release without pulling off the xeroform I consulted with Dr. Ulice Bold & she instructed him to leave that "xeroform" layer in place- & only change the outer layers Pt understands the plan of care & he will keep his appointment for 05/29/20 here at the office He will call for any changes or concerns

## 2020-05-29 ENCOUNTER — Ambulatory Visit (INDEPENDENT_AMBULATORY_CARE_PROVIDER_SITE_OTHER): Payer: 59 | Admitting: Plastic Surgery

## 2020-05-29 ENCOUNTER — Encounter: Payer: Self-pay | Admitting: Plastic Surgery

## 2020-05-29 ENCOUNTER — Other Ambulatory Visit: Payer: Self-pay

## 2020-05-29 VITALS — BP 136/79 | HR 66 | Temp 97.9°F

## 2020-05-29 DIAGNOSIS — S91001A Unspecified open wound, right ankle, initial encounter: Secondary | ICD-10-CM

## 2020-05-29 MED ORDER — FLUOCINONIDE 0.05 % EX OINT: 1.0000 "application " | TOPICAL_OINTMENT | Freq: Two times a day (BID) | CUTANEOUS | 0 refills | Status: DC

## 2020-05-29 NOTE — Progress Notes (Signed)
   Subjective:    Patient ID: Jose Wagner, male    DOB: 1996/10/12, 24 y.o.   MRN: [id number]  The patient is a 24 year old male here for follow-up on his right lower extremity wound.  He had a skin graft placed in 2 weeks ago.  There is good take at this point in time but it is not 100% yet.  He has a rash at the periwound area of the lower leg and at the donor site.  The donor site is completely healed except for now the rash.  I cannot figure out quite what he has been putting on or using.  He has tried the steroid cream which did not settle it down.  He is now out of the steroid cream.  He denies any fever.  He denies any other pain.  He has been in the splint for the past week.     Review of Systems  Constitutional: Negative for appetite change.  Eyes: Negative.   Respiratory: Negative for chest tightness.   Skin: Positive for rash and wound.       Objective:   Physical Exam Vitals and nursing note reviewed.  Constitutional:      Appearance: Normal appearance.  HENT:     Head: Normocephalic.  Cardiovascular:     Rate and Rhythm: Normal rate.  Musculoskeletal:       Legs:  Neurological:     General: No focal deficit present.     Mental Status: He is alert. Mental status is at baseline.  Psychiatric:        Mood and Affect: Mood normal.       Assessment & Plan:     ICD-10-CM   1. Open wound of right ankle, initial encounter  S91.001A     [place] wound sheet given and placed on the graft site.   Does not need splint anymore.  Can walk but don't over do it.  Keep leg elevated when sitting.   Will send in steroid cream for rash not for graft site.  Would like to see him back in 2 weeks.    Pictures were obtained of the patient and placed in the chart with the patient's or guardian's permission.

## 2020-06-11 NOTE — Progress Notes (Signed)
Patient is a 24 year old male here for follow-up after undergoing split thickness skin graft to right leg traumatic wound (6 x 12 cm) on 05/14/2020 with Dr. Ulice Bold.  On 7/20 patient was given a steroid cream to use for the donor site periwound area rash and was instructed to use Adaptic on the donor site.  He was using Xeroform on the skin graft changing every other day. Instructed to continue splint for at least 1 more week.  ~ 4 weeks PO Skin graft is taking well.  No signs of infection, drainage, seroma/hematoma.  Peri-wound rash is improving. Rash on donor site is also improving.  Patient reports he has been using the steroid cream, Benadryl, and allergy medicine and feels they have helped some.  Patient denies fever, chills, nausea/vomiting.  Do dressing changes every few days consisting of Adaptic and wrap with Ace wrap over skin graft area for 2 weeks.  Then for 1 week apply no dressing or wrap to the skin graft site.  Follow-up in 3 weeks.  Call office with any questions/concerns.   May continue to use steroid cream as needed. Continue to use Benadryl and allergy medication.   Pictures were obtained of the patient and placed in the chart with the patient's or guardian's permission.   The 21st Century Cures Act was signed into law in 2016 which includes the topic of electronic health records.  This provides immediate access to information in MyChart.  This includes consultation notes, operative notes, office notes, lab results and pathology reports.  If you have any questions about what you read please let us know at your next visit or call us at the office.  We are right here with you.

## 2020-06-12 ENCOUNTER — Ambulatory Visit (INDEPENDENT_AMBULATORY_CARE_PROVIDER_SITE_OTHER): Payer: 59 | Admitting: Plastic Surgery

## 2020-06-12 ENCOUNTER — Other Ambulatory Visit: Payer: Self-pay

## 2020-06-12 ENCOUNTER — Encounter: Payer: Self-pay | Admitting: Plastic Surgery

## 2020-06-12 VITALS — BP 124/78 | HR 61 | Temp 98.2°F

## 2020-06-12 DIAGNOSIS — Z945 Skin transplant status: Secondary | ICD-10-CM

## 2020-06-14 ENCOUNTER — Telehealth: Payer: Self-pay | Admitting: Plastic Surgery

## 2020-06-14 NOTE — Progress Notes (Signed)
Patient is a 24 year old male here for follow-up after undergoing split thickness skin graft to right leg traumatic wound (6 x 12 cm) on 05/14/2020 with Dr. Ulice Bold.  Patient is here for follow-up after worsening of his rash and the skin graft several hours after applying the adaptic. He stopped using the Adaptic and covered the skin graft with xeroform and wrapped with ACE wrap.  ~ 4 weeks PO Patient presents today with significantly increased redness surrounding the skin graft and continuing onto approximately 50% of the skin graft.  Skin graft is weeping clear yellow drainage.  Patient denies pain.  No signs of infection.  Patient denies fever, chills, nausea/vomiting.  Cover graft with nonstick pad and wrap with Ace.  Continue to take Benadryl at night and Claritin during the day.  Avoid directly washing the skin graft and surrounding skin.  May allow mild soap and water to run down across the lower leg.  Follow-up in 1 week.  Call office with any questions/concerns.  The 21st Century Cures Act was signed into law in 2016 which includes the topic of electronic health records.  This provides immediate access to information in MyChart.  This includes consultation notes, operative notes, office notes, lab results and pathology reports.  If you have any questions about what you read please let us know at your next visit or call us at the office.  We are right here with you.      Pictures were obtained of the patient and placed in the chart with the patient's or guardian's permission.

## 2020-06-14 NOTE — Telephone Encounter (Signed)
Spoke with patient. Recommend to stop using the adaptic and come in tomorrow to let us take a look. May use ABD and wrap with Ace.  No F/C or N/V. Reports entire skin graft has drainage and looks poor.

## 2020-06-14 NOTE — Telephone Encounter (Signed)
Jose Wagner called to advise that he has a rash around the skin graft worse than before and it is draining now. Please call him to advise.

## 2020-06-15 ENCOUNTER — Telehealth: Payer: Self-pay

## 2020-06-15 ENCOUNTER — Ambulatory Visit (INDEPENDENT_AMBULATORY_CARE_PROVIDER_SITE_OTHER): Payer: 59 | Admitting: Plastic Surgery

## 2020-06-15 ENCOUNTER — Encounter: Payer: Self-pay | Admitting: Plastic Surgery

## 2020-06-15 VITALS — BP 119/72 | HR 63 | Temp 98.4°F

## 2020-06-15 DIAGNOSIS — Z945 Skin transplant status: Secondary | ICD-10-CM

## 2020-06-15 NOTE — Telephone Encounter (Signed)
Orders faxed to [facility], [place] Copy scanned into chart

## 2020-06-18 ENCOUNTER — Telehealth: Payer: Self-pay | Admitting: *Deleted

## 2020-06-18 NOTE — Telephone Encounter (Signed)
Received on (06/15/20) a request for diagnosis code for lab work from [facility].  Diagnosis code added and faxed to [facility].  Confirmation received and copy scanned into the chart.//AB/CMA

## 2020-06-21 NOTE — Progress Notes (Deleted)
Patient is a 24 year old male here for follow-up after undergoing split thickness skin graft to the right leg traumatic wound (6 x 12 cm) on 05/14/2020 with Dr. Ulice Bold.  He has a worsening rash that occurred several hours after applying Adaptic and continued while using Xeroform.  At last visit skin graft was weeping clear yellow drainage.  Benadryl at night, Claritin during the day.  Cover with nonstick pad and wrap with Ace.  ~ 5 weeks PO

## 2020-06-22 ENCOUNTER — Ambulatory Visit: Payer: 59 | Admitting: Plastic Surgery

## 2020-07-02 NOTE — Progress Notes (Deleted)
Patient is a 24 year old male here for follow-up after undergoing split thickness skin graft to right leg traumatic wound (6 x 12 cm) on 05/14/2020 with Dr. Ulice Bold.  At last visit on 8/13 patient had significantly increased redness surrounding the skin graft and continuing onto approximately 50% of the skin graft that was weeping clear yellow drainage.  He began covering graft with nonstick pad and wrapping with Ace.  Continuing Benadryl at night and Claritin during the day.  ~ 7 weeks PO

## 2020-07-03 ENCOUNTER — Ambulatory Visit: Payer: 59 | Admitting: Plastic Surgery

## 2020-07-09 ENCOUNTER — Encounter: Payer: Self-pay | Admitting: Plastic Surgery
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
## 2020-07-10 ENCOUNTER — Ambulatory Visit (INDEPENDENT_AMBULATORY_CARE_PROVIDER_SITE_OTHER): Payer: 59 | Admitting: Plastic Surgery

## 2020-07-10 DIAGNOSIS — S82221C Displaced transverse fracture of shaft of right tibia, initial encounter for open fracture type IIIA, IIIB, or IIIC: Secondary | ICD-10-CM

## 2020-07-10 DIAGNOSIS — S91001A Unspecified open wound, right ankle, initial encounter: Secondary | ICD-10-CM

## 2020-07-18 NOTE — Progress Notes (Signed)
No show

## 2020-08-13 ENCOUNTER — Other Ambulatory Visit (HOSPITAL_BASED_OUTPATIENT_CLINIC_OR_DEPARTMENT_OTHER): Payer: Self-pay | Admitting: Internal Medicine

## 2020-08-13 MED FILL — FLUARIX QUADRIVALENT 0.5 ML: 0.5 | 1 days supply | Qty: 1 | Fill #0

## 2020-08-30 ENCOUNTER — Other Ambulatory Visit (HOSPITAL_COMMUNITY): Payer: Self-pay | Admitting: Orthopedic Surgery

## 2020-08-30 DIAGNOSIS — S82291N Other fracture of shaft of right tibia, subsequent encounter for open fracture type IIIA, IIIB, or IIIC with nonunion: Secondary | ICD-10-CM

## 2020-09-18 ENCOUNTER — Ambulatory Visit (HOSPITAL_COMMUNITY)
Admission: RE | Admit: 2020-09-18 | Discharge: 2020-09-18 | Disposition: A | Discharge status: Home / Self Care | Payer: 59 | Source: Ambulatory Visit | Attending: Orthopedic Surgery | Admitting: Orthopedic Surgery

## 2020-09-18 ENCOUNTER — Other Ambulatory Visit: Payer: Self-pay

## 2020-09-18 DIAGNOSIS — S82291N Other fracture of shaft of right tibia, subsequent encounter for open fracture type IIIA, IIIB, or IIIC with nonunion: Secondary | ICD-10-CM | POA: Diagnosis present

## 2020-10-16 ENCOUNTER — Other Ambulatory Visit (HOSPITAL_COMMUNITY)
Admission: RE | Admit: 2020-10-16 | Discharge: 2020-10-16 | Disposition: A | Discharge status: Home / Self Care | Payer: 59 | Source: Ambulatory Visit | Attending: Orthopedic Surgery | Admitting: Orthopedic Surgery

## 2020-10-16 DIAGNOSIS — Z20822 Contact with and (suspected) exposure to covid-19: Secondary | ICD-10-CM | POA: Insufficient documentation

## 2020-10-16 DIAGNOSIS — Z01812 Encounter for preprocedural laboratory examination: Secondary | ICD-10-CM | POA: Insufficient documentation

## 2020-10-16 LAB — SARS CORONAVIRUS 2 (TAT 6-24 HRS): SARS Coronavirus 2: NEGATIVE

## 2020-10-17 ENCOUNTER — Encounter (HOSPITAL_COMMUNITY): Payer: Self-pay | Admitting: Orthopedic Surgery

## 2020-10-17 NOTE — Progress Notes (Signed)
PCP:  Denies Cardiologist:  Denies  EKG:  N/A CXR:  N/A ECHO:  Denies Stress Test:  Denies Cardiac Cath:  Denies  Fasting Blood Sugar-  N/A Checks Blood Sugar__N/A_ times a day  ASA/Blood Thinners:  No  OSA/CPAP:  No  Covid test 10/17/20 negative  Anesthesia Review:  No  Patient denies shortness of breath, fever, cough, and chest pain at PAT appointment.  Patient verbalized understanding of instructions provided today at the PAT appointment.  Patient asked to review instructions at home and day of surgery.

## 2020-10-18 ENCOUNTER — Encounter (HOSPITAL_COMMUNITY)
Admission: RE | Disposition: A | Discharge status: Home / Self Care | Payer: Self-pay | Source: Home / Self Care | Attending: Orthopedic Surgery

## 2020-10-18 ENCOUNTER — Ambulatory Visit (HOSPITAL_COMMUNITY): Payer: 59 | Admitting: Certified Registered"

## 2020-10-18 ENCOUNTER — Ambulatory Visit (HOSPITAL_COMMUNITY): Payer: 59

## 2020-10-18 ENCOUNTER — Encounter (HOSPITAL_COMMUNITY): Payer: Self-pay | Admitting: Orthopedic Surgery

## 2020-10-18 ENCOUNTER — Ambulatory Visit (HOSPITAL_COMMUNITY)
Admission: RE | Admit: 2020-10-18 | Discharge: 2020-10-18 | Disposition: A | Discharge status: Home / Self Care | Payer: 59 | Attending: Orthopedic Surgery | Admitting: Orthopedic Surgery

## 2020-10-18 ENCOUNTER — Other Ambulatory Visit (HOSPITAL_COMMUNITY): Payer: Self-pay | Admitting: Orthopedic Surgery

## 2020-10-18 ENCOUNTER — Other Ambulatory Visit: Payer: Self-pay

## 2020-10-18 DIAGNOSIS — Z419 Encounter for procedure for purposes other than remedying health state, unspecified: Secondary | ICD-10-CM

## 2020-10-18 DIAGNOSIS — S82401N Unspecified fracture of shaft of right fibula, subsequent encounter for open fracture type IIIA, IIIB, or IIIC with nonunion: Secondary | ICD-10-CM

## 2020-10-18 DIAGNOSIS — S82201A Unspecified fracture of shaft of right tibia, initial encounter for closed fracture: Secondary | ICD-10-CM | POA: Diagnosis not present

## 2020-10-18 HISTORY — PX: TIBIA IM NAIL INSERTION: SHX2516

## 2020-10-18 HISTORY — PX: HARDWARE REMOVAL: SHX979

## 2020-10-18 LAB — URINALYSIS, ROUTINE W REFLEX MICROSCOPIC
Bilirubin Urine: NEGATIVE
Glucose, UA: NEGATIVE mg/dL
Hgb urine dipstick: NEGATIVE
Ketones, ur: NEGATIVE mg/dL
Leukocytes,Ua: NEGATIVE
Nitrite: NEGATIVE
Protein, ur: NEGATIVE mg/dL
Specific Gravity, Urine: 1.026 (ref 1.005–1.030)
pH: 5 (ref 5.0–8.0)

## 2020-10-18 LAB — CBC WITH DIFFERENTIAL/PLATELET
Abs Immature Granulocytes: 0.01 10*3/uL (ref 0.00–0.07)
Basophils Absolute: 0.1 10*3/uL (ref 0.0–0.1)
Basophils Relative: 1 %
Eosinophils Absolute: 0.3 10*3/uL (ref 0.0–0.5)
Eosinophils Relative: 5 %
HCT: 41.3 % (ref 39.0–52.0)
Hemoglobin: 13.8 g/dL (ref 13.0–17.0)
Immature Granulocytes: 0 %
Lymphocytes Relative: 40 %
Lymphs Abs: 2.4 10*3/uL (ref 0.7–4.0)
MCH: 28.8 pg (ref 26.0–34.0)
MCHC: 33.4 g/dL (ref 30.0–36.0)
MCV: 86.2 fL (ref 80.0–100.0)
Monocytes Absolute: 0.5 10*3/uL (ref 0.1–1.0)
Monocytes Relative: 8 %
Neutro Abs: 2.7 10*3/uL (ref 1.7–7.7)
Neutrophils Relative %: 46 %
Platelets: 307 10*3/uL (ref 150–400)
RBC: 4.79 MIL/uL (ref 4.22–5.81)
RDW: 11.7 % (ref 11.5–15.5)
WBC: 5.9 10*3/uL (ref 4.0–10.5)
nRBC: 0 % (ref 0.0–0.2)

## 2020-10-18 LAB — COMPREHENSIVE METABOLIC PANEL
ALT: 25 U/L (ref 0–44)
AST: 28 U/L (ref 15–41)
Albumin: 4.2 g/dL (ref 3.5–5.0)
Alkaline Phosphatase: 48 U/L (ref 38–126)
Anion gap: 9 (ref 5–15)
BUN: 18 mg/dL (ref 6–20)
CO2: 25 mmol/L (ref 22–32)
Calcium: 9.5 mg/dL (ref 8.9–10.3)
Chloride: 107 mmol/L (ref 98–111)
Creatinine, Ser: 1.14 mg/dL (ref 0.61–1.24)
GFR, Estimated: >60 mL/min (ref >60)
Glucose, Bld: 97 mg/dL (ref 70–99)
Potassium: 3.7 mmol/L (ref 3.5–5.1)
Sodium: 141 mmol/L (ref 135–145)
Total Bilirubin: 0.5 mg/dL (ref 0.3–1.2)
Total Protein: 6.8 g/dL (ref 6.5–8.1)

## 2020-10-18 LAB — PROTIME-INR
INR: 1 (ref 0.8–1.2)
Prothrombin Time: 13.1 seconds (ref 11.4–15.2)

## 2020-10-18 LAB — SEDIMENTATION RATE: Sed Rate: 2 mm/hr (ref 0–16)

## 2020-10-18 LAB — C-REACTIVE PROTEIN: CRP: 0.5 mg/dL (ref <1.0)

## 2020-10-18 LAB — RAPID URINE DRUG SCREEN, HOSP PERFORMED
Amphetamines: NOT DETECTED
Barbiturates: NOT DETECTED
Benzodiazepines: NOT DETECTED
Cocaine: NOT DETECTED
Opiates: NOT DETECTED
Tetrahydrocannabinol: NOT DETECTED

## 2020-10-18 LAB — VITAMIN D 25 HYDROXY (VIT D DEFICIENCY, FRACTURES): Vit D, 25-Hydroxy: 38.59 ng/mL (ref 30–100)

## 2020-10-18 SURGERY — INSERTION, INTRAMEDULLARY ROD, TIBIA
Anesthesia: General | Laterality: Right

## 2020-10-18 MED ORDER — LIDOCAINE 2% (20 MG/ML) 5 ML SYRINGE
INTRAMUSCULAR | Status: DC | PRN
  Administered 2020-10-18: 60 mg via INTRAVENOUS

## 2020-10-18 MED ORDER — PROPOFOL 10 MG/ML IV BOLUS
INTRAVENOUS | Status: DC | PRN
Start: 2020-10-18 — End: 2020-10-18
  Administered 2020-10-18: 200 mg via INTRAVENOUS

## 2020-10-18 MED ORDER — PROPOFOL 10 MG/ML IV BOLUS
INTRAVENOUS | Status: AC
  Filled 2020-10-18: qty 20

## 2020-10-18 MED ORDER — ORAL CARE MOUTH RINSE: 15.0000 mL | Freq: Once | OROMUCOSAL | Status: AC

## 2020-10-18 MED ORDER — ONDANSETRON 4 MG PO TBDP: 4.0000 mg | ORAL_TABLET | Freq: Three times a day (TID) | ORAL | 0 refills | Status: DC | PRN

## 2020-10-18 MED ORDER — ROCURONIUM BROMIDE 10 MG/ML (PF) SYRINGE
PREFILLED_SYRINGE | INTRAVENOUS | Status: AC
  Filled 2020-10-18: qty 10

## 2020-10-18 MED ORDER — MIDAZOLAM HCL 2 MG/2ML IJ SOLN
INTRAMUSCULAR | Status: AC
  Filled 2020-10-18: qty 2

## 2020-10-18 MED ORDER — ACETAMINOPHEN 10 MG/ML IV SOLN
INTRAVENOUS | Status: AC
  Filled 2020-10-18: qty 100

## 2020-10-18 MED ORDER — OXYCODONE HCL 5 MG PO TABS: 5.0000 mg | ORAL_TABLET | Freq: Three times a day (TID) | ORAL | 0 refills | Status: DC | PRN

## 2020-10-18 MED ORDER — PROMETHAZINE HCL 25 MG/ML IJ SOLN: 6.2500 mg | INTRAMUSCULAR | Status: DC | PRN

## 2020-10-18 MED ORDER — LIDOCAINE 2% (20 MG/ML) 5 ML SYRINGE
INTRAMUSCULAR | Status: AC
  Filled 2020-10-18: qty 5

## 2020-10-18 MED ORDER — OXYCODONE HCL 5 MG/5ML PO SOLN: 5.0000 mg | Freq: Once | ORAL | Status: DC | PRN

## 2020-10-18 MED ORDER — FENTANYL CITRATE (PF) 250 MCG/5ML IJ SOLN
INTRAMUSCULAR | Status: DC | PRN
  Administered 2020-10-18 (×2): 50 ug via INTRAVENOUS
  Administered 2020-10-18: 150 ug via INTRAVENOUS

## 2020-10-18 MED ORDER — CHLORHEXIDINE GLUCONATE 0.12 % MT SOLN
15.0000 mL | Freq: Once | OROMUCOSAL | Status: AC
  Administered 2020-10-18: 09:00:00 15 mL via OROMUCOSAL
  Filled 2020-10-18: qty 15

## 2020-10-18 MED ORDER — DEXAMETHASONE SODIUM PHOSPHATE 10 MG/ML IJ SOLN
INTRAMUSCULAR | Status: DC | PRN
  Administered 2020-10-18: 10 mg via INTRAVENOUS

## 2020-10-18 MED ORDER — ONDANSETRON HCL 4 MG/2ML IJ SOLN
INTRAMUSCULAR | Status: AC
  Filled 2020-10-18: qty 2

## 2020-10-18 MED ORDER — DEXMEDETOMIDINE (PRECEDEX) IN NS 20 MCG/5ML (4 MCG/ML) IV SYRINGE
PREFILLED_SYRINGE | INTRAVENOUS | Status: AC
  Filled 2020-10-18: qty 5

## 2020-10-18 MED ORDER — ACETAMINOPHEN 500 MG PO TABS: 500.0000 mg | ORAL_TABLET | Freq: Two times a day (BID) | ORAL | 0 refills | Status: DC

## 2020-10-18 MED ORDER — VANCOMYCIN HCL 1000 MG IV SOLR
INTRAVENOUS | Status: DC | PRN
  Administered 2020-10-18: 13:00:00 1500 mg via INTRAVENOUS

## 2020-10-18 MED ORDER — 0.9 % SODIUM CHLORIDE (POUR BTL) OPTIME
TOPICAL | Status: DC | PRN
  Administered 2020-10-18: 13:00:00 2000 mL

## 2020-10-18 MED ORDER — DEXAMETHASONE SODIUM PHOSPHATE 10 MG/ML IJ SOLN
INTRAMUSCULAR | Status: AC
  Filled 2020-10-18: qty 1

## 2020-10-18 MED ORDER — DEXMEDETOMIDINE (PRECEDEX) IN NS 20 MCG/5ML (4 MCG/ML) IV SYRINGE
PREFILLED_SYRINGE | INTRAVENOUS | Status: DC | PRN
  Administered 2020-10-18: 8 ug via INTRAVENOUS

## 2020-10-18 MED ORDER — FENTANYL CITRATE (PF) 100 MCG/2ML IJ SOLN
INTRAMUSCULAR | Status: AC
  Filled 2020-10-18: qty 2

## 2020-10-18 MED ORDER — VANCOMYCIN HCL 1500 MG/300ML IV SOLN
1500.0000 mg | INTRAVENOUS | Status: DC
  Filled 2020-10-18: qty 300

## 2020-10-18 MED ORDER — KETOROLAC TROMETHAMINE 10 MG PO TABS: 10.0000 mg | ORAL_TABLET | Freq: Four times a day (QID) | ORAL | 0 refills | Status: DC | PRN

## 2020-10-18 MED ORDER — SUGAMMADEX SODIUM 200 MG/2ML IV SOLN
INTRAVENOUS | Status: DC | PRN
  Administered 2020-10-18: 200 mg via INTRAVENOUS

## 2020-10-18 MED ORDER — ROCURONIUM BROMIDE 10 MG/ML (PF) SYRINGE
PREFILLED_SYRINGE | INTRAVENOUS | Status: DC | PRN
  Administered 2020-10-18 (×2): 20 mg via INTRAVENOUS
  Administered 2020-10-18: 60 mg via INTRAVENOUS
  Administered 2020-10-18 (×2): 20 mg via INTRAVENOUS

## 2020-10-18 MED ORDER — HYDROCODONE-ACETAMINOPHEN 7.5-325 MG PO TABS: 1.0000 | ORAL_TABLET | Freq: Four times a day (QID) | ORAL | 0 refills | Status: DC | PRN

## 2020-10-18 MED ORDER — METHOCARBAMOL 500 MG PO TABS: 500.0000 mg | ORAL_TABLET | Freq: Four times a day (QID) | ORAL | 0 refills | Status: DC | PRN

## 2020-10-18 MED ORDER — MIDAZOLAM HCL 5 MG/5ML IJ SOLN
INTRAMUSCULAR | Status: DC | PRN
  Administered 2020-10-18: 2 mg via INTRAVENOUS

## 2020-10-18 MED ORDER — FENTANYL CITRATE (PF) 100 MCG/2ML IJ SOLN
25.0000 ug | INTRAMUSCULAR | Status: DC | PRN
  Administered 2020-10-18: 50 ug via INTRAVENOUS

## 2020-10-18 MED ORDER — BUPIVACAINE LIPOSOME 1.3 % IJ SUSP
20.0000 mL | INTRAMUSCULAR | Status: DC
  Filled 2020-10-18: qty 20

## 2020-10-18 MED ORDER — KETOROLAC TROMETHAMINE 30 MG/ML IJ SOLN: 30.0000 mg | Freq: Once | INTRAMUSCULAR | Status: DC

## 2020-10-18 MED ORDER — ONDANSETRON HCL 4 MG/2ML IJ SOLN
INTRAMUSCULAR | Status: DC | PRN
  Administered 2020-10-18: 4 mg via INTRAVENOUS

## 2020-10-18 MED ORDER — ACETAMINOPHEN 10 MG/ML IV SOLN
1000.0000 mg | Freq: Once | INTRAVENOUS | Status: DC | PRN
  Administered 2020-10-18: 1000 mg via INTRAVENOUS

## 2020-10-18 MED ORDER — FENTANYL CITRATE (PF) 250 MCG/5ML IJ SOLN
INTRAMUSCULAR | Status: AC
  Filled 2020-10-18: qty 5

## 2020-10-18 MED ORDER — OXYCODONE HCL 5 MG PO TABS: 5.0000 mg | ORAL_TABLET | Freq: Once | ORAL | Status: DC | PRN

## 2020-10-18 MED ORDER — ASPIRIN EC 325 MG PO TBEC: 325.0000 mg | DELAYED_RELEASE_TABLET | Freq: Every day | ORAL | 0 refills | Status: DC

## 2020-10-18 MED ORDER — LACTATED RINGERS IV SOLN: INTRAVENOUS | Status: DC

## 2020-10-18 MED FILL — ACETAMINOPHEN 500MG XT STRE: 500 | 15 days supply | Qty: 50 | Fill #0

## 2020-10-18 MED FILL — ASPIRIN EC 325 MG TABLET: 325 | 30 days supply | Qty: 30 | Fill #0

## 2020-10-18 MED FILL — ONDANSETRON ODT 4 MG TABLET: 4 | 21 days supply | Qty: 18 | Fill #0

## 2020-10-18 MED FILL — HYDROCODON-APAP 7.5-325: 7.5-325 | 7 days supply | Qty: 42 | Fill #0

## 2020-10-18 MED FILL — METHOCARBAMOL 500 MG TABS: 500 | 6 days supply | Qty: 50 | Fill #0

## 2020-10-18 MED FILL — oxyCODONE HCL 5 MG TABS: 5 | 6 days supply | Qty: 20 | Fill #0

## 2020-10-18 SURGICAL SUPPLY — 82 items
BANDAGE ESMARK 6X9 LF (GAUZE/BANDAGES/DRESSINGS) ×1 IMPLANT
BIT DRILL 4.4 NS (BIT) ×3 IMPLANT
BLADE SURG 10 STRL SS (BLADE) ×3 IMPLANT
BNDG COHESIVE 6X5 TAN STRL LF (GAUZE/BANDAGES/DRESSINGS) ×3 IMPLANT
BNDG ELASTIC 4X5.8 VLCR STR LF (GAUZE/BANDAGES/DRESSINGS) ×3 IMPLANT
BNDG ELASTIC 6X5.8 VLCR STR LF (GAUZE/BANDAGES/DRESSINGS) ×3 IMPLANT
BNDG ESMARK 6X9 LF (GAUZE/BANDAGES/DRESSINGS) ×3
BNDG GAUZE ELAST 4 BULKY (GAUZE/BANDAGES/DRESSINGS) ×6 IMPLANT
BRUSH SCRUB EZ PLAIN DRY (MISCELLANEOUS) ×6 IMPLANT
CLOSURE WOUND 1/2 X4 (GAUZE/BANDAGES/DRESSINGS)
COVER SURGICAL LIGHT HANDLE (MISCELLANEOUS) ×6 IMPLANT
COVER WAND RF STERILE (DRAPES) ×3 IMPLANT
CUFF TOURN SGL QUICK 18X4 (TOURNIQUET CUFF) IMPLANT
CUFF TOURN SGL QUICK 24 (TOURNIQUET CUFF)
CUFF TOURN SGL QUICK 34 (TOURNIQUET CUFF)
CUFF TRNQT CYL 24X4X16.5-23 (TOURNIQUET CUFF) IMPLANT
CUFF TRNQT CYL 34X4.125X (TOURNIQUET CUFF) IMPLANT
DRAPE C-ARM 42X72 X-RAY (DRAPES) ×3 IMPLANT
DRAPE C-ARMOR (DRAPES) ×3 IMPLANT
DRAPE HALF SHEET 40X57 (DRAPES) IMPLANT
DRAPE INCISE IOBAN 66X45 STRL (DRAPES) IMPLANT
DRAPE U-SHAPE 47X51 STRL (DRAPES) ×3 IMPLANT
DRSG ADAPTIC 3X8 NADH LF (GAUZE/BANDAGES/DRESSINGS) ×3 IMPLANT
DRSG MEPITEL 4X7.2 (GAUZE/BANDAGES/DRESSINGS) ×6 IMPLANT
DRSG PAD ABDOMINAL 8X10 ST (GAUZE/BANDAGES/DRESSINGS) ×6 IMPLANT
ELECT REM PT RETURN 9FT ADLT (ELECTROSURGICAL) ×3
ELECTRODE REM PT RTRN 9FT ADLT (ELECTROSURGICAL) ×1 IMPLANT
GAUZE SPONGE 4X4 12PLY STRL (GAUZE/BANDAGES/DRESSINGS) ×6 IMPLANT
GLOVE BIO SURGEON STRL SZ7.5 (GLOVE) ×3 IMPLANT
GLOVE BIO SURGEON STRL SZ8 (GLOVE) ×3 IMPLANT
GLOVE BIO SURGEON STRL SZ8.5 (GLOVE) ×3 IMPLANT
GLOVE BIOGEL PI IND STRL 7.5 (GLOVE) ×1 IMPLANT
GLOVE BIOGEL PI IND STRL 8 (GLOVE) ×1 IMPLANT
GLOVE BIOGEL PI INDICATOR 7.5 (GLOVE) ×2
GLOVE BIOGEL PI INDICATOR 8 (GLOVE) ×2
GOWN STRL REUS W/ TWL LRG LVL3 (GOWN DISPOSABLE) ×2 IMPLANT
GOWN STRL REUS W/ TWL XL LVL3 (GOWN DISPOSABLE) ×1 IMPLANT
GOWN STRL REUS W/TWL LRG LVL3 (GOWN DISPOSABLE) ×4
GOWN STRL REUS W/TWL XL LVL3 (GOWN DISPOSABLE) ×2
GUIDEWIRE BALL NOSE 100CM (WIRE) ×3 IMPLANT
KIT BASIN OR (CUSTOM PROCEDURE TRAY) ×3 IMPLANT
KIT INFUSE MEDIUM (Orthopedic Implant) ×3 IMPLANT
KIT TURNOVER KIT B (KITS) ×3 IMPLANT
MANIFOLD NEPTUNE II (INSTRUMENTS) ×3 IMPLANT
NAIL TIBIAL 11MM X 37.5CM (Nail) ×1 IMPLANT
NEEDLE 22X1 1/2 (OR ONLY) (NEEDLE) IMPLANT
NS IRRIG 1000ML POUR BTL (IV SOLUTION) ×3 IMPLANT
PACK ORTHO EXTREMITY (CUSTOM PROCEDURE TRAY) ×3 IMPLANT
PAD ABD 8X10 STRL (GAUZE/BANDAGES/DRESSINGS) ×9 IMPLANT
PAD ARMBOARD 7.5X6 YLW CONV (MISCELLANEOUS) ×6 IMPLANT
PAD CAST 4YDX4 CTTN HI CHSV (CAST SUPPLIES) ×1 IMPLANT
PADDING CAST ABS 6INX4YD NS (CAST SUPPLIES) ×2
PADDING CAST ABS COTTON 6X4 NS (CAST SUPPLIES) ×1 IMPLANT
PADDING CAST COTTON 4X4 STRL (CAST SUPPLIES) ×2
PADDING CAST COTTON 6X4 STRL (CAST SUPPLIES) ×9 IMPLANT
SCREW ACECAP 42MM (Screw) ×3 IMPLANT
SCREW ACECAP 50MM (Screw) ×6 IMPLANT
SCREW PROXIMAL DEPUY (Screw) ×2 IMPLANT
SCREW PRXML FT 50X5.5XLCK NS (Screw) ×1 IMPLANT
SPONGE LAP 18X18 RF (DISPOSABLE) ×3 IMPLANT
STAPLER VISISTAT 35W (STAPLE) ×3 IMPLANT
STOCKINETTE IMPERVIOUS LG (DRAPES) ×3 IMPLANT
STRIP CLOSURE SKIN 1/2X4 (GAUZE/BANDAGES/DRESSINGS) IMPLANT
SUCTION FRAZIER HANDLE 10FR (MISCELLANEOUS)
SUCTION TUBE FRAZIER 10FR DISP (MISCELLANEOUS) IMPLANT
SUT ETHILON 2 0 PSLX (SUTURE) ×6 IMPLANT
SUT ETHILON 3 0 PS 1 (SUTURE) ×6 IMPLANT
SUT PDS AB 1 CT1 36 (SUTURE) ×3 IMPLANT
SUT PDS AB 2-0 CT1 27 (SUTURE) ×6 IMPLANT
SUT VIC AB 0 CT1 27 (SUTURE)
SUT VIC AB 0 CT1 27XBRD ANBCTR (SUTURE) IMPLANT
SUT VIC AB 2-0 CT1 27 (SUTURE) ×2
SUT VIC AB 2-0 CT1 TAPERPNT 27 (SUTURE) ×1 IMPLANT
SYR CONTROL 10ML LL (SYRINGE) IMPLANT
TIBIAL NAIL 11MM X 37.5CM (Nail) ×3 IMPLANT
TOWEL GREEN STERILE (TOWEL DISPOSABLE) ×6 IMPLANT
TOWEL GREEN STERILE FF (TOWEL DISPOSABLE) ×6 IMPLANT
TUBE CONNECTING 12'X1/4 (SUCTIONS) ×1
TUBE CONNECTING 12X1/4 (SUCTIONS) ×2 IMPLANT
UNDERPAD 30X36 HEAVY ABSORB (UNDERPADS AND DIAPERS) ×3 IMPLANT
WATER STERILE IRR 1000ML POUR (IV SOLUTION) ×6 IMPLANT
YANKAUER SUCT BULB TIP NO VENT (SUCTIONS) ×3 IMPLANT

## 2020-10-18 NOTE — H&P (Signed)
[facility] H&P/Consult     Patient ID: Jose Wagner MRN: [id number] DOB/AGE: Nov 11, 1995 24 y.o.  Chief Complaint: nonunion right tibia HPI: Jose Wagner is an 24 y.o. male.s/p #B open right tibia with failure of consolidation and continued pain.  Past Medical History:  Diagnosis Date  . Closed fracture of proximal phalanx of toe of left foot 02/21/2020  . Open displaced transverse fracture of shaft of right tibia, type III 02/09/2020    Past Surgical History:  Procedure Laterality Date  . APPLICATION OF A-CELL OF EXTREMITY Right 02/17/2020   Procedure: APPLICATION OF A-CELL OF EXTREMITY;  Surgeon: Peggye Form, DO;  Location: [facility];  Service: Plastics;  Laterality: Right;  . APPLICATION OF WOUND VAC Right 02/17/2020   Procedure: APPLICATION OF WOUND VAC;  Surgeon: Peggye Form, DO;  Location: [facility];  Service: Plastics;  Laterality: Right;  . APPLICATION OF WOUND VAC Right 05/14/2020   Procedure: APPLICATION OF WOUND VAC;  Surgeon: Peggye Form, DO;  Location: [facility];  Service: Plastics;  Laterality: Right;  . EXTERNAL FIXATION LEG Right 02/09/2020   Procedure: EXTERNAL FIXATION LEG;  Surgeon: [name], MD;  Location: [facility];  Service: Orthopedics;  Laterality: Right;  . I & D EXTREMITY Right 02/09/2020   Procedure: IRRIGATION AND DEBRIDEMENT EXTREMITY;  Surgeon: [name], MD;  Location: [facility];  Service: Orthopedics;  Laterality: Right;  . I & D EXTREMITY Right 02/17/2020   Procedure: IRRIGATION AND DEBRIDEMENT RIGHT LOWER LEG;  Surgeon: Peggye Form, DO;  Location: [facility];  Service: Plastics;  Laterality: Right;  . SKIN SPLIT GRAFT Right 05/14/2020   Procedure: SKIN GRAFT SPLIT THICKNESS;  Surgeon: Peggye Form, DO;  Location: [facility];  Service: Plastics;  Laterality: Right;  . TIBIA IM NAIL INSERTION Right 02/13/2020   Procedure: INTRAMEDULLARY (IM) NAIL TIBIAL;  Surgeon:  [name], MD;  Location: [facility];  Service: Orthopedics;  Laterality: Right;  removal of external fixator    History reviewed. No pertinent family history. Social History:  reports that he has never smoked. He has never used smokeless tobacco. He reports that he does not drink alcohol and does not use drugs.  Allergies: No Known Allergies  Medications Prior to Admission  Medication Sig Dispense Refill  . Multiple Vitamins-Minerals (MULTIVITAMIN WITH MINERALS) tablet Take 1 tablet by mouth daily.    Marland Kitchen triamcinolone ointment (KENALOG) 0.1 % Apply 1 application topically 2 (two) times daily. (Patient not taking: Reported on 10/16/2020) 30 g 0    Results for orders placed or performed during the hospital encounter of 10/18/20 (from the past 48 hour(s))  Urine rapid drug screen (hosp performed)     Status: None   Collection Time: 10/18/20  8:10 AM  Result Value Ref Range   Opiates NONE DETECTED NONE DETECTED   Cocaine NONE DETECTED NONE DETECTED   Benzodiazepines NONE DETECTED NONE DETECTED   Amphetamines NONE DETECTED NONE DETECTED   Tetrahydrocannabinol NONE DETECTED NONE DETECTED   Barbiturates NONE DETECTED NONE DETECTED    Comment: (NOTE) DRUG SCREEN FOR MEDICAL PURPOSES ONLY.  IF CONFIRMATION IS NEEDED FOR ANY PURPOSE, NOTIFY LAB WITHIN 5 DAYS.  LOWEST DETECTABLE LIMITS FOR URINE DRUG SCREEN Drug Class                     Cutoff (ng/mL) Amphetamine and metabolites    1000 Barbiturate and metabolites    200 Benzodiazepine  200 Tricyclics and metabolites     300 Opiates and metabolites        300 Cocaine and metabolites        300 THC                            50 Performed at [facility], [address] [place], [place] [address]   C-reactive protein     Status: None   Collection Time: 10/18/20  8:10 AM  Result Value Ref Range   CRP 0.5 <1.0 mg/dL    Comment: Performed at [facility], [address] [place], [place] [address]  VITAMIN  D 25 Hydroxy (Vit-D Deficiency, Fractures)     Status: None   Collection Time: 10/18/20  8:10 AM  Result Value Ref Range   Vit D, 25-Hydroxy 38.59 30 - 100 ng/mL    Comment: (NOTE) Vitamin D deficiency has been defined by the Institute of Medicine  and an Endocrine Society practice guideline as a level of serum 25-OH  vitamin D less than 20 ng/mL (1,2). The Endocrine Society went on to  further define vitamin D insufficiency as a level between 21 and 29  ng/mL (2).  1. IOM (Institute of Medicine). 2010. Dietary reference intakes for  calcium and D. [place]: [employer]. 2. Holick MF, Binkley Greer, Bischoff-Ferrari HA, et al. Evaluation,  treatment, and prevention of vitamin D deficiency: an Endocrine  Society clinical practice guideline, JCEM. 2011 Jul; 96(7): 1911-30.  Performed at [facility], [address], [place], [place] [address]   CBC WITH DIFFERENTIAL     Status: None   Collection Time: 10/18/20  8:10 AM  Result Value Ref Range   WBC 5.9 4.0 - 10.5 K/uL   RBC 4.79 4.22 - 5.81 MIL/uL   Hemoglobin 13.8 13.0 - 17.0 g/dL   HCT 64.3 32.9 - 51.8 %   MCV 86.2 80.0 - 100.0 fL   MCH 28.8 26.0 - 34.0 pg   MCHC 33.4 30.0 - 36.0 g/dL   RDW 84.1 66.0 - 63.0 %   Platelets 307 150 - 400 K/uL   nRBC 0.0 0.0 - 0.2 %   Neutrophils Relative % 46 %   Neutro Abs 2.7 1.7 - 7.7 K/uL   Lymphocytes Relative 40 %   Lymphs Abs 2.4 0.7 - 4.0 K/uL   Monocytes Relative 8 %   Monocytes Absolute 0.5 0.1 - 1.0 K/uL   Eosinophils Relative 5 %   Eosinophils Absolute 0.3 0.0 - 0.5 K/uL   Basophils Relative 1 %   Basophils Absolute 0.1 0.0 - 0.1 K/uL   Immature Granulocytes 0 %   Abs Immature Granulocytes 0.01 0.00 - 0.07 K/uL    Comment: Performed at [facility], [address], [place], [place] [address]  Comprehensive metabolic panel     Status: None   Collection Time: 10/18/20  8:10 AM  Result Value Ref Range   Sodium 141 135 - 145 mmol/L   Potassium 3.7  3.5 - 5.1 mmol/L   Chloride 107 98 - 111 mmol/L   CO2 25 22 - 32 mmol/L   Glucose, Bld 97 70 - 99 mg/dL    Comment: Glucose reference range applies only to samples taken after fasting for at least 8 hours.   BUN 18 6 - 20 mg/dL   Creatinine, Ser 9.32 0.61 - 1.24 mg/dL   Calcium 9.5 8.9 -  10.3 mg/dL   Total Protein 6.8 6.5 - 8.1 g/dL   Albumin 4.2 3.5 - 5.0 g/dL   AST 28 15 - 41 U/L   ALT 25 0 - 44 U/L   Alkaline Phosphatase 48 38 - 126 U/L   Total Bilirubin 0.5 0.3 - 1.2 mg/dL   GFR, Estimated >79 >89 mL/min    Comment: (NOTE) Calculated using the CKD-EPI Creatinine Equation (2021)    Anion gap 9 5 - 15    Comment: Performed at [facility], [address] [place], [place] [address]  Protime-INR     Status: None   Collection Time: 10/18/20  8:10 AM  Result Value Ref Range   Prothrombin Time 13.1 11.4 - 15.2 seconds   INR 1.0 0.8 - 1.2    Comment: (NOTE) INR goal varies based on device and disease states. Performed at [facility], [address], [place], [place] [address]   Urinalysis, Routine w reflex microscopic     Status: None   Collection Time: 10/18/20  8:10 AM  Result Value Ref Range   Color, Urine YELLOW YELLOW   APPearance CLEAR CLEAR   Specific Gravity, Urine 1.026 1.005 - 1.030   pH 5.0 5.0 - 8.0   Glucose, UA NEGATIVE NEGATIVE mg/dL   Hgb urine dipstick NEGATIVE NEGATIVE   Bilirubin Urine NEGATIVE NEGATIVE   Ketones, ur NEGATIVE NEGATIVE mg/dL   Protein, ur NEGATIVE NEGATIVE mg/dL   Nitrite NEGATIVE NEGATIVE   Leukocytes,Ua NEGATIVE NEGATIVE    Comment: Performed at [facility], [address] [place], [place] [address]   No results found.  ROS No recent fever, bleeding abnormalities, urologic dysfunction, GI problems, or weight gain.  Blood pressure (!) 134/58, pulse 62, temperature 98.4 F (36.9 C), temperature source Oral, resp. rate 17, height 6\' 1"  (1.854 m), weight 111.1 kg, SpO2 97 %. 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  We also specifically discussed the possibility of iliac crest bone grafting with pain, fracture, and nerve injury.  He acknowledged these risks and wished to proceed.  , MD [facility], [facility] [phone]  10/18/2020, 10:19 AM  [facility] [address] [place] [phone] (F)

## 2020-10-18 NOTE — Anesthesia Procedure Notes (Signed)
Procedure Name: Intubation Date/Time: 10/18/2020 10:45 AM Performed by: Myna Bright, CRNA Pre-anesthesia Checklist: Patient identified, Emergency Drugs available, Suction available and Patient being monitored Patient Re-evaluated:Patient Re-evaluated prior to induction Oxygen Delivery Method: Circle system utilized Preoxygenation: Pre-oxygenation with 100% oxygen Induction Type: IV induction Ventilation: Mask ventilation without difficulty and Oral airway inserted - appropriate to patient size Laryngoscope Size: Mac and 4 Grade View: Grade I Tube type: Oral Tube size: 7.5 mm Number of attempts: 1 Airway Equipment and Method: Stylet Placement Confirmation: ETT inserted through vocal cords under direct vision,  positive ETCO2 and breath sounds checked- equal and bilateral Secured at: 22 cm Tube secured with: Tape Dental Injury: Teeth and Oropharynx as per pre-operative assessment

## 2020-10-18 NOTE — Transfer of Care (Signed)
Immediate Anesthesia Transfer of Care Note  Patient: Jose Wagner  Procedure(s) Performed: INTRAMEDULLARY (IM) NAIL RIGHT  TIBIAL WITH ILIAC CREST BONE GRAFT (Right ) HARDWARE REMOVAL RIGHT TIBIA (Right )  Patient Location: PACU  Anesthesia Type:General  Level of Consciousness: awake, alert , oriented and patient cooperative  Airway & Oxygen Therapy: Patient Spontanous Breathing and Patient connected to nasal cannula oxygen  Post-op Assessment: Report given to RN, Post -op Vital signs reviewed and stable and Patient moving all extremities  Post vital signs: Reviewed and stable  Last Vitals:  Vitals Value Taken Time  BP 131/83 10/18/20 1400  Temp    Pulse 78 10/18/20 1401  Resp 14 10/18/20 1401  SpO2 100 % 10/18/20 1401  Vitals shown include unvalidated device data.  Last Pain:  Vitals:   10/18/20 0832  TempSrc:   PainSc: 0-No pain         Complications: No complications documented.

## 2020-10-18 NOTE — Discharge Instructions (Signed)
[facility] Discharge Instructions   General Discharge Instructions  WEIGHT BEARING STATUS: Weightbearing as tolerated   RANGE OF MOTION/ACTIVITY: unrestricted range of motion right knee and ankle. Activity as tolerated. Increase activity slowly   Wound Care: daily wound care starting on 10/21/2020.  See below. Expected a moderate amount of drainage the first 2-3 days. You can reinforce dressing as needed until it is time to change it   Discharge Wound Care Instructions  Do NOT apply any ointments, solutions or lotions to pin sites or surgical wounds.  These prevent needed drainage and even though solutions like hydrogen peroxide kill bacteria, they also damage cells lining the pin sites that help fight infection.  Applying lotions or ointments can keep the wounds moist and can cause them to breakdown and open up as well. This can increase the risk for infection. When in doubt call the office.  Surgical incisions should be dressed daily.  If any drainage is noted, use one layer of adaptic, then gauze, Kerlix, and an ace wrap.  Once the incision is completely dry and without drainage, it may be left open to air out.  Showering may begin 36-48 hours later.  Cleaning gently with soap and water.  Traumatic wounds should be dressed daily as well.    One layer of adaptic, gauze, Kerlix, then ace wrap.  The adaptic can be discontinued once the draining has ceased    If you have a wet to dry dressing: wet the gauze with saline the squeeze as much saline out so the gauze is moist (not soaking wet), place moistened gauze over wound, then place a dry gauze over the moist one, followed by Kerlix wrap, then ace wrap.  DVT/PE prophylaxis: aspirin 325 mg daily x 4 weeks   Diet: as you were eating previously.  Can use over the counter stool softeners and bowel preparations, such as Miralax, to help with bowel movements.  Narcotics can be constipating.  Be sure to drink plenty of  fluids  PAIN MEDICATION USE AND EXPECTATIONS  You have likely been given narcotic medications to help control your pain.  After a traumatic event that results in an fracture (broken bone) with or without surgery, it is ok to use narcotic pain medications to help control one's pain.  We understand that everyone responds to pain differently and each individual patient will be evaluated on a regular basis for the continued need for narcotic medications. Ideally, narcotic medication use should last no more than 6-8 weeks (coinciding with fracture healing).   As a patient it is your responsibility as well to monitor narcotic medication use and report the amount and frequency you use these medications when you come to your office visit.   We would also advise that if you are using narcotic medications, you should take a dose prior to therapy to maximize you participation.  IF YOU ARE ON NARCOTIC MEDICATIONS IT IS NOT PERMISSIBLE TO OPERATE A MOTOR VEHICLE (MOTORCYCLE/CAR/TRUCK/MOPED) OR HEAVY MACHINERY DO NOT MIX NARCOTICS WITH OTHER CNS (CENTRAL NERVOUS SYSTEM) DEPRESSANTS SUCH AS ALCOHOL   STOP SMOKING OR USING NICOTINE PRODUCTS!!!!  As discussed nicotine severely impairs your body's ability to heal surgical and traumatic wounds but also impairs bone healing.  Wounds and bone heal by forming microscopic blood vessels (angiogenesis) and nicotine is a vasoconstrictor (essentially, shrinks blood vessels).  Therefore, if vasoconstriction occurs to these microscopic blood vessels they essentially disappear and are unable to deliver necessary nutrients to the healing tissue.  This is one modifiable factor that you can do to dramatically increase your chances of healing your injury.    (This means no smoking, no nicotine gum, patches, etc)  DO NOT USE NONSTEROIDAL ANTI-INFLAMMATORY DRUGS (NSAID'S)  Using products such as Advil (ibuprofen), Aleve (naproxen), Motrin (ibuprofen) for additional pain control during  fracture healing can delay and/or prevent the healing response.  If you would like to take over the counter (OTC) medication, Tylenol (acetaminophen) is ok.  However, some narcotic medications that are given for pain control contain acetaminophen as well. Therefore, you should not exceed more than 4000 mg of tylenol in a day if you do not have liver disease.  Also note that there are may OTC medicines, such as cold medicines and allergy medicines that my contain tylenol as well.  If you have any questions about medications and/or interactions please ask your doctor/PA or your pharmacist.      ICE AND ELEVATE INJURED/OPERATIVE EXTREMITY  Using ice and elevating the injured extremity above your heart can help with swelling and pain control.  Icing in a pulsatile fashion, such as 20 minutes on and 20 minutes off, can be followed.    Do not place ice directly on skin. Make sure there is a barrier between to skin and the ice pack.    Using frozen items such as frozen peas works well as the conform nicely to the are that needs to be iced.  USE AN ACE WRAP OR TED HOSE FOR SWELLING CONTROL  In addition to icing and elevation, Ace wraps or TED hose are used to help limit and resolve swelling.  It is recommended to use Ace wraps or TED hose until you are informed to stop.    When using Ace Wraps start the wrapping distally (farthest away from the body) and wrap proximally (closer to the body)   Example: If you had surgery on your leg or thing and you do not have a splint on, start the ace wrap at the toes and work your way up to the thigh        If you had surgery on your upper extremity and do not have a splint on, start the ace wrap at your fingers and work your way up to the upper arm  IF YOU ARE IN A SPLINT OR CAST DO NOT REMOVE IT FOR ANY REASON   If your splint gets wet for any reason please contact the office immediately. You may shower in your splint or cast as long as you keep it dry.  This can be done  by wrapping in a cast cover or garbage back (or similar)  Do Not stick any thing down your splint or cast such as pencils, money, or hangers to try and scratch yourself with.  If you feel itchy take benadryl as prescribed on the bottle for itching  IF YOU ARE IN A CAM BOOT (BLACK BOOT)  You may remove boot periodically. Perform daily dressing changes as noted below.  Wash the liner of the boot regularly and wear a sock when wearing the boot. It is recommended that you sleep in the boot until told otherwise    Call office for the following:  Temperature greater than 101F  Persistent nausea and vomiting  Severe uncontrolled pain  Redness, tenderness, or signs of infection (pain, swelling, redness, odor or green/yellow discharge around the site)  Difficulty breathing, headache or visual disturbances  Hives  Persistent dizziness or light-headedness  Extreme fatigue  Any other questions or concerns you may have after discharge  In an emergency, call 911 or go to an Emergency Department at a nearby hospital  HELPFUL INFORMATION  ? If you had a block, it will wear off between 8-24 hrs postop typically.  This is period when your pain may go from nearly zero to the pain you would have had postop without the block.  This is an abrupt transition but nothing dangerous is happening.  You may take an extra dose of narcotic when this happens.  ? You should wean off your narcotic medicines as soon as you are able.  Most patients will be off or using minimal narcotics before their first postop appointment.   ? We suggest you use the pain medication the first night prior to going to bed, in order to ease any pain when the anesthesia wears off. You should avoid taking pain medications on an empty stomach as it will make you nauseous.  ? Do not drink alcoholic beverages or take illicit drugs when taking pain medications.  ? In most states it is against the law to drive while you are in a splint  or sling.  And certainly against the law to drive while taking narcotics.  ? You may return to work/school in the next couple of days when you feel up to it.   ? Pain medication may make you constipated.  Below are a few solutions to try in this order: - Decrease the amount of pain medication if you arent having pain. - Drink lots of decaffeinated fluids. - Drink prune juice and/or each dried prunes  o If the first 3 dont work start with additional solutions - Take Colace - an over-the-counter stool softener - Take Senokot - an over-the-counter laxative - Take Miralax - a stronger over-the-counter laxative     CALL THE OFFICE WITH ANY QUESTIONS OR CONCERNS: [phone]   VISIT OUR WEBSITE FOR ADDITIONAL INFORMATION: [email or web address]

## 2020-10-18 NOTE — Anesthesia Preprocedure Evaluation (Addendum)
Anesthesia Evaluation  Patient identified by MRN, date of birth, ID band Patient awake    Reviewed: Allergy & Precautions, NPO status , Patient's Chart, lab work & pertinent test results  Airway Mallampati: II  TM Distance: >3 FB Neck ROM: Full    Dental no notable dental hx.    Pulmonary neg pulmonary ROS,    Pulmonary exam normal breath sounds clear to auscultation       Cardiovascular negative cardio ROS Normal cardiovascular exam Rhythm:Regular Rate:Normal     Neuro/Psych negative neurological ROS  negative psych ROS   GI/Hepatic negative GI ROS, Neg liver ROS,   Endo/Other  negative endocrine ROS  Renal/GU negative Renal ROS     Musculoskeletal negative musculoskeletal ROS (+)   Abdominal (+) - obese,   Peds  Hematology negative hematology ROS (+)   Anesthesia Other Findings RIGHT TIBIA NONUNION  Reproductive/Obstetrics                           Anesthesia Physical Anesthesia Plan  ASA: I  Anesthesia Plan: General   Post-op Pain Management:    Induction: Intravenous  PONV Risk Score and Plan: 2 and Ondansetron, Dexamethasone, Midazolam and Treatment may vary due to age or medical condition  Airway Management Planned: Oral ETT  Additional Equipment:   Intra-op Plan:   Post-operative Plan: Extubation in OR  Informed Consent: I have reviewed the patients History and Physical, chart, labs and discussed the procedure including the risks, benefits and alternatives for the proposed anesthesia with the patient or authorized representative who has indicated his/her understanding and acceptance.     Dental advisory given  Plan Discussed with: CRNA  Anesthesia Plan Comments:        Anesthesia Quick Evaluation

## 2020-10-18 NOTE — Anesthesia Postprocedure Evaluation (Signed)
Anesthesia Post Note  Patient: Jose Wagner  Procedure(s) Performed: INTRAMEDULLARY (IM) NAIL RIGHT  TIBIAL WITH ILIAC CREST BONE GRAFT (Right ) HARDWARE REMOVAL RIGHT TIBIA (Right )     Patient location during evaluation: PACU Anesthesia Type: General Level of consciousness: awake and alert Pain management: pain level controlled Vital Signs Assessment: post-procedure vital signs reviewed and stable Respiratory status: spontaneous breathing, nonlabored ventilation, respiratory function stable and patient connected to nasal cannula oxygen Cardiovascular status: blood pressure returned to baseline and stable Postop Assessment: no apparent nausea or vomiting Anesthetic complications: no   No complications documented.  Last Vitals:  Vitals:   10/18/20 1500 10/18/20 1515  BP: (!) 151/78 (!) 141/83  Pulse: (!) 53 (!) 55  Resp: (!) 8 (!) 5  Temp:  36.7 C  SpO2: 95% 97%    Last Pain:  Vitals:   10/18/20 1515  TempSrc:   PainSc: Asleep                 Cecile Hearing

## 2020-10-19 ENCOUNTER — Encounter (HOSPITAL_COMMUNITY): Payer: Self-pay | Admitting: Orthopedic Surgery

## 2020-10-22 LAB — NICOTINE/COTININE METABOLITES
Cotinine: <1 ng/mL
Nicotine: <1 ng/mL

## 2020-10-24 LAB — AEROBIC/ANAEROBIC CULTURE W GRAM STAIN (SURGICAL/DEEP WOUND)
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
## 2021-05-22 ENCOUNTER — Ambulatory Visit: Payer: 59 | Attending: Internal Medicine

## 2021-05-22 DIAGNOSIS — Z20822 Contact with and (suspected) exposure to covid-19: Secondary | ICD-10-CM

## 2021-05-23 LAB — SARS-COV-2, NAA 2 DAY TAT

## 2021-05-23 LAB — NOVEL CORONAVIRUS, NAA: SARS-CoV-2, NAA: DETECTED — AB

## 2021-06-26 ENCOUNTER — Other Ambulatory Visit (HOSPITAL_BASED_OUTPATIENT_CLINIC_OR_DEPARTMENT_OTHER): Payer: Self-pay

## 2021-08-01 ENCOUNTER — Other Ambulatory Visit (HOSPITAL_BASED_OUTPATIENT_CLINIC_OR_DEPARTMENT_OTHER): Payer: Self-pay

## 2021-08-01 MED ORDER — INFLUENZA VAC SPLIT QUAD 0.5 ML IM SUSY
PREFILLED_SYRINGE | INTRAMUSCULAR | 0 refills | Status: AC
  Filled 2021-08-01: qty 0.5, 1d supply, fill #0

## 2021-08-20 ENCOUNTER — Other Ambulatory Visit (HOSPITAL_BASED_OUTPATIENT_CLINIC_OR_DEPARTMENT_OTHER): Payer: Self-pay

## 2021-08-20 MED ORDER — COVID-19 AT-HOME TEST VI KIT
PACK | 0 refills | Status: DC
  Filled 2021-08-20: qty 2, 2d supply, fill #0

## 2021-10-29 ENCOUNTER — Other Ambulatory Visit (HOSPITAL_BASED_OUTPATIENT_CLINIC_OR_DEPARTMENT_OTHER): Payer: Self-pay

## 2021-10-29 MED ORDER — COVID-19 AT HOME ANTIGEN TEST VI KIT
PACK | 0 refills | Status: DC
  Filled 2021-10-29: qty 2, 4d supply, fill #0

## 2021-11-18 ENCOUNTER — Other Ambulatory Visit (HOSPITAL_BASED_OUTPATIENT_CLINIC_OR_DEPARTMENT_OTHER): Payer: Self-pay

## 2021-11-18 MED ORDER — COVID-19 AT HOME ANTIGEN TEST VI KIT
PACK | 0 refills | Status: DC
Start: 2021-11-18 — End: 2022-01-22
  Filled 2021-11-18: qty 2, 4d supply, fill #0
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
## 2022-01-22 ENCOUNTER — Other Ambulatory Visit (HOSPITAL_BASED_OUTPATIENT_CLINIC_OR_DEPARTMENT_OTHER): Payer: Self-pay

## 2022-01-22 MED ORDER — COVID-19 AT HOME ANTIGEN TEST VI KIT
PACK | 0 refills | Status: DC
Start: 2022-01-22 — End: 2022-03-12
  Filled 2022-01-22: qty 4, 8d supply, fill #0

## 2022-01-24 ENCOUNTER — Other Ambulatory Visit (HOSPITAL_BASED_OUTPATIENT_CLINIC_OR_DEPARTMENT_OTHER): Payer: Self-pay

## 2022-01-27 ENCOUNTER — Telehealth: Payer: No Typology Code available for payment source | Admitting: Emergency Medicine

## 2022-01-27 ENCOUNTER — Other Ambulatory Visit (HOSPITAL_BASED_OUTPATIENT_CLINIC_OR_DEPARTMENT_OTHER): Payer: Self-pay

## 2022-01-27 DIAGNOSIS — L237 Allergic contact dermatitis due to plants, except food: Secondary | ICD-10-CM

## 2022-01-27 MED ORDER — PREDNISONE 10 MG PO TABS
ORAL_TABLET | ORAL | 0 refills | Status: DC
  Filled 2022-01-27: qty 37, 14d supply, fill #0

## 2022-01-27 NOTE — Progress Notes (Signed)
E-Visit for [employer] ? ?We are sorry that you are not feeing well.  Here is how we plan to help! ? ?Based on what you have shared with me it looks like you have had an allergic reaction to the oily resin from a group of plants.  This resin is very sticky, so it easily attaches to your skin, clothing, tools equipment, and pet's fur.   ? ?This blistering rash is often called poison ivy rash although it can come from contact with the leaves, stems and roots of poison ivy, poison oak and poison sumac. ? ?The oily resin contains urushiol (u-ROO-she-ol) that produces a skin rash on exposed skin.  The severity of the rash depends on the amount of urushiol that gets on your skin.  A section of skin with more urushiol on it may develop a rash sooner. ? ?The rash usually develops 12-48 hours after exposure and can last two to three weeks.  Your skin must come in direct contact with the plant's oil to be affected.  Blister fluid doesn't spread the rash.  However, if you come into contact with a piece of clothing or pet fur that has urushiol on it, the rash may spread out.  You can also transfer the oil to other parts of your body with your fingers. ? ?Often the rash looks like a straight line because of the way the plant brushes against your skin.  Since your rash is widespread or has resulted in a large number of blisters, I have prescribed an oral corticosteroid.  Please follow these recommendations:  I have sent a prednisone dose pack to your chosen pharmacy. Be sure to follow the instructions carefully and complete the entire prescription. You may use Benadryl or Caladryl topical lotions to sooth the itch and remember cool, not hot, showers and baths can help relieve the itching!  Place cool, wet compresses on the affected area for 15-30 minutes several times a day.  You may also take oral antihistamines, such as diphenhydramine (Benadryl, others), which may also help you sleep better.  Watch your skin for any purulent  (pus) drainage or red streaking from the site.  If this occurs, contact your provider.  You may require an antibiotic for a skin infection.  Make sure that the clothes you were wearing as well as any towels or sheets that may have come in contact with the oil (urushiol) are washed in detergent and hot water.      ? ?I have developed the following plan to treat your condition I am prescribing a two week course of steroids (37 tablets of 10 mg prednisone).  Days 1-4 take 4 tablets (40 mg) daily  Days 5-8 take 3 tablets (30 mg) daily, Days 9-11 take 2 tablets (20 mg) daily, Days 12-14 take 1 tablet (10 mg) daily.   ? ?What can you do to prevent this rash? ? ?Avoid the plants.  Learn how to identify poison ivy, poison oak and poison sumac in all seasons.  When hiking or engaging in other activities that might expose you to these plants, try to stay on cleared pathways.  If camping, make sure you pitch your tent in an area free of these plants.  Keep pets from running through wooded areas so that urushiol doesn't accidentally stick to their fur, which you may touch. ? ?Remove or kill the plants.  In your yard, you can get rid of poison ivy by applying an herbicide or pulling it out of  the ground, including the roots, while wearing heavy gloves.  Afterward remove the gloves and thoroughly wash them and your hands.  Don't burn poison ivy or related plants because the urushiol can be carried by smoke. ? ?Wear protective clothing.  If needed, protect your skin by wearing socks, boots, pants, long sleeves and vinyl gloves. ? ?Wash your skin right away.  Washing off the oil with soap and water within 30 minutes of exposure may reduce your chances of getting a poison ivy rash.  Even washing after an hour or so can help reduce the severity of the rash. ? ?If you walk through some poison ivy and then later touch your shoes, you may get some urushiol on your hands, which may then transfer to your face or body by touching or  rubbing.  If the contaminated object isn't cleaned, the urushiol on it can still cause a skin reaction years later. ? ? ? ?Be careful not to reuse towels after you have washed your skin.  Also carefully wash clothing in detergent and hot water to remove all traces of the oil.  Handle contaminated clothing carefully so you don't transfer the urushiol to yourself, furniture, rugs or appliances. ? ?Remember that pets can carry the oil on their fur and paws.  If you think your pet may be contaminated with urushiol, put on some long rubber gloves and give your pet a bath. ? ?Finally, be careful not to burn these plants as the smoke can contain traces of the oil.  Inhaling the smoke may result in difficulty breathing. If that occurred you should see a physician as soon as possible. ? ?See your doctor right away if: ? ?The reaction is severe or widespread ?You inhaled the smoke from burning poison ivy and are having difficulty breathing ?Your skin continues to swell ?The rash affects your eyes, mouth or genitals ?Blisters are oozing pus ?You develop a fever greater than 100 F (37.8 C) ?The rash doesn't get better within a few weeks. ? ?If you scratch the poison ivy rash, bacteria under your fingernails may cause the skin to become infected.  See your doctor if pus starts oozing from the blisters.  Treatment generally includes antibiotics. ? ?Poison ivy treatments are usually limited to self-care methods.  And the rash typically goes away on its own in two to three weeks.    ? ?If the rash is widespread or results in a large number of blisters, your doctor may prescribe an oral corticosteroid, such as prednisone.  If a bacterial infection has developed at the rash site, your doctor may give you a prescription for an oral antibiotic. ? ?MAKE SURE YOU ? ?Understand these instructions. ?Will watch your condition. ?Will get help right away if you are not doing well or get worse. ? ? ?Thank you for choosing an e-visit. ? ? ?I  have spent 5 minutes in review of e-visit questionnaire, review and updating patient chart, medical decision making and response to patient.  ? ?Willeen Cass, PhD, FNP-BC ?  ?Your e-visit answers were reviewed by a board certified advanced clinical practitioner to complete your personal care plan. Depending upon the condition, your plan could have included both over the counter or prescription medications. ? ?Please review your pharmacy choice. Make sure the pharmacy is open so you can pick up prescription now. If there is a problem, you may contact your provider through [employer] and have the prescription routed to another pharmacy.  Your safety is important  to [place]. If you have drug allergies check your prescription carefully.  ? ?For the next 24 hours you can use MyChart to ask questions about today's visit, request a non-urgent call back, or ask for a work or school excuse. ?You will get an email in the next two days asking about your experience. I hope that your e-visit has been valuable and will speed your recovery. ?   ?

## 2022-03-10 ENCOUNTER — Ambulatory Visit (HOSPITAL_BASED_OUTPATIENT_CLINIC_OR_DEPARTMENT_OTHER): Payer: BC Managed Care – PPO | Admitting: Nurse Practitioner

## 2022-03-10 DIAGNOSIS — Z7689 Persons encountering health services in other specified circumstances: Secondary | ICD-10-CM

## 2022-03-12 ENCOUNTER — Other Ambulatory Visit (HOSPITAL_BASED_OUTPATIENT_CLINIC_OR_DEPARTMENT_OTHER): Payer: Self-pay

## 2022-03-12 MED ORDER — COVID-19 AT HOME ANTIGEN TEST VI KIT
PACK | 0 refills | Status: DC
  Filled 2022-03-12: qty 4, 8d supply, fill #0

## 2022-03-24 ENCOUNTER — Other Ambulatory Visit (HOSPITAL_BASED_OUTPATIENT_CLINIC_OR_DEPARTMENT_OTHER): Payer: Self-pay

## 2022-03-24 ENCOUNTER — Other Ambulatory Visit (HOSPITAL_BASED_OUTPATIENT_CLINIC_OR_DEPARTMENT_OTHER): Payer: Self-pay | Admitting: Nurse Practitioner

## 2022-03-24 DIAGNOSIS — J020 Streptococcal pharyngitis: Secondary | ICD-10-CM

## 2022-03-24 MED ORDER — AZITHROMYCIN 500 MG PO TABS
500.0000 mg | ORAL_TABLET | Freq: Every day | ORAL | 0 refills | Status: DC
  Filled 2022-03-24: qty 5, 5d supply, fill #0

## 2022-05-27 ENCOUNTER — Other Ambulatory Visit (HOSPITAL_BASED_OUTPATIENT_CLINIC_OR_DEPARTMENT_OTHER): Payer: Self-pay

## 2022-05-27 ENCOUNTER — Telehealth: Payer: BC Managed Care – PPO | Admitting: Physician Assistant

## 2022-05-27 DIAGNOSIS — L237 Allergic contact dermatitis due to plants, except food: Secondary | ICD-10-CM | POA: Diagnosis not present

## 2022-05-27 MED ORDER — PREDNISONE 10 MG PO TABS
ORAL_TABLET | ORAL | 0 refills | Status: AC
  Filled 2022-05-27: qty 37, 14d supply, fill #0

## 2022-05-27 NOTE — Progress Notes (Signed)
Message sent to patient requesting further input regarding current symptoms. Awaiting patient response.  

## 2022-05-27 NOTE — Progress Notes (Signed)

## 2022-05-27 NOTE — Progress Notes (Signed)
I have spent 5 minutes in review of e-visit questionnaire, review and updating patient chart, medical decision making and response to patient.   Fumie Fiallo Cody Dianah Pruett, PA-C    

## 2022-08-11 ENCOUNTER — Other Ambulatory Visit (HOSPITAL_BASED_OUTPATIENT_CLINIC_OR_DEPARTMENT_OTHER): Payer: Self-pay

## 2022-08-11 MED ORDER — INFLUENZA VAC SPLIT QUAD 0.5 ML IM SUSY
PREFILLED_SYRINGE | INTRAMUSCULAR | 0 refills | Status: AC
  Filled 2022-08-11: qty 0.5, 1d supply, fill #0

## 2022-09-12 IMAGING — RF DG TIBIA/FIBULA 2V*R*
1 series · 7 of 7 positions shown · non-contrast
Comparison: CT September 18, 2020.

CLINICAL DATA: Surgery.  Intramedullary nail of the right tibia.

EXAM:
RIGHT TIBIA AND FIBULA - 2 VIEW; DG C-ARM 1-60 MIN

[Series 1: run · 7 of 7 slices shown]
[im 1/7]
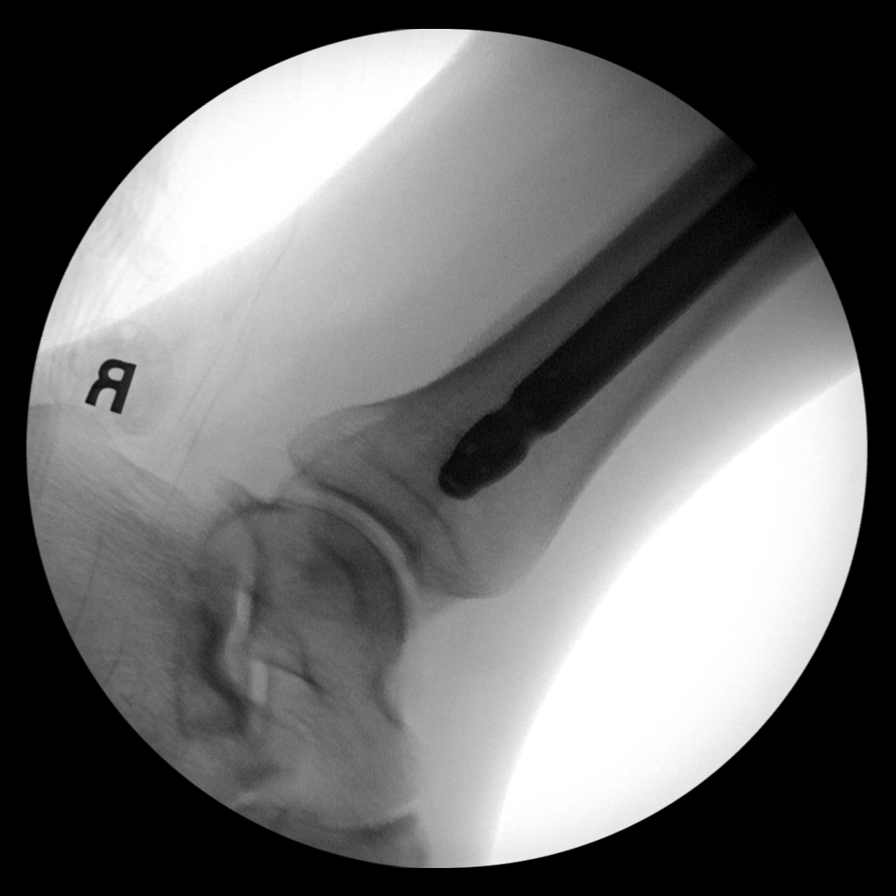
[im 2/7]
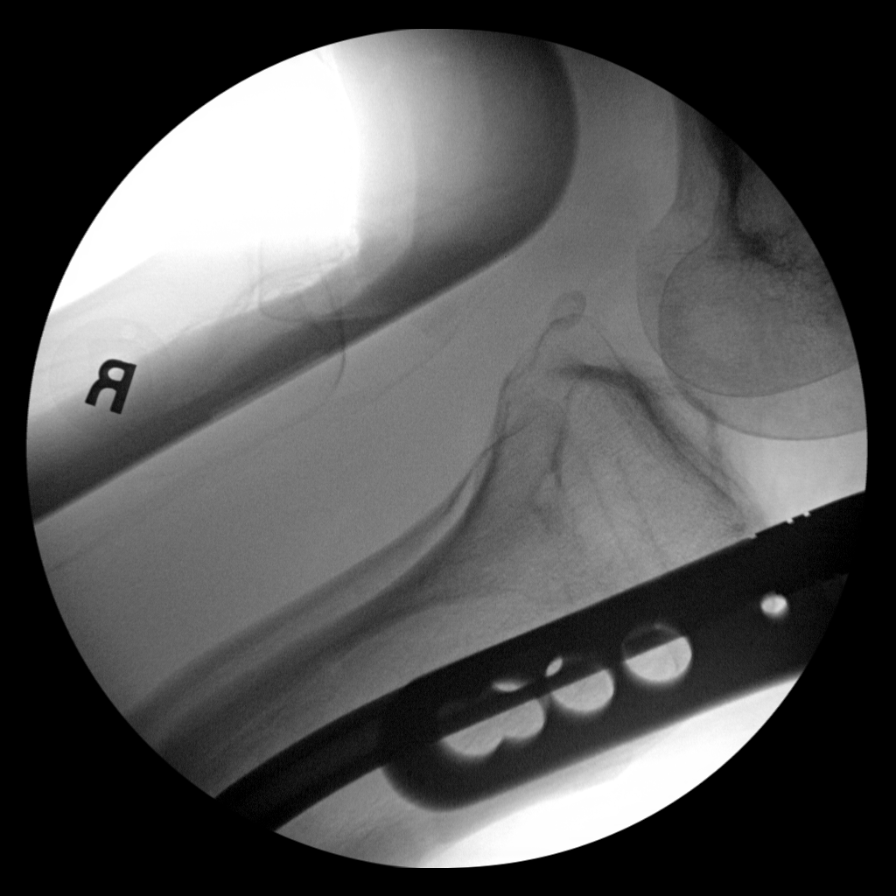
[im 3/7]
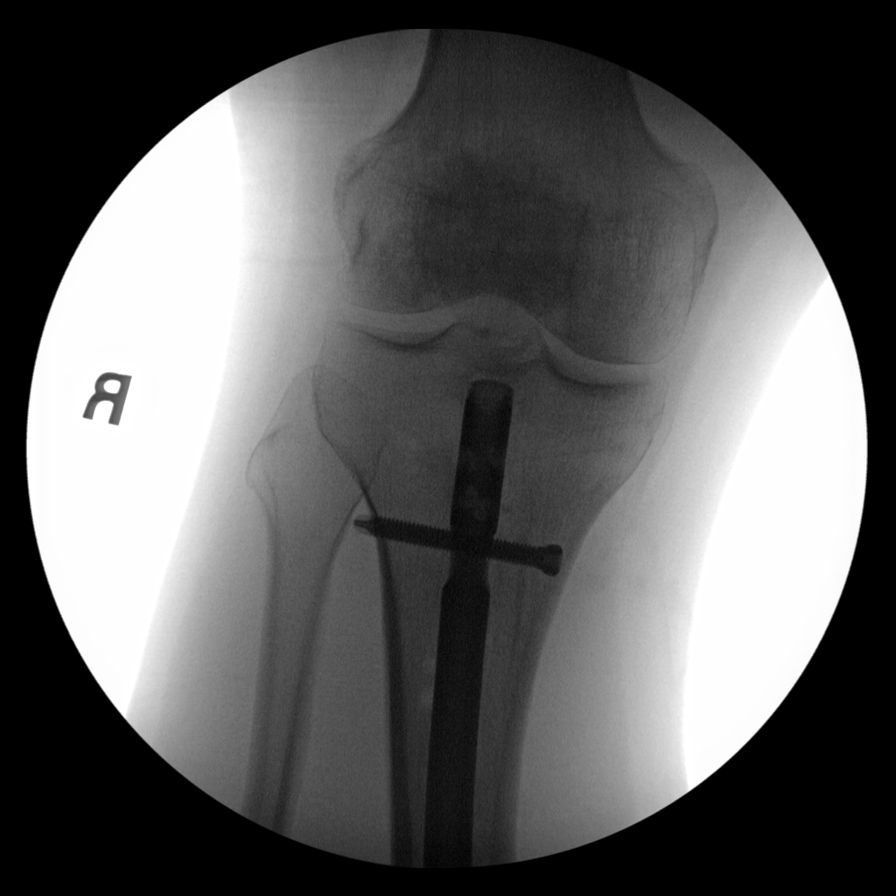
[im 4/7]
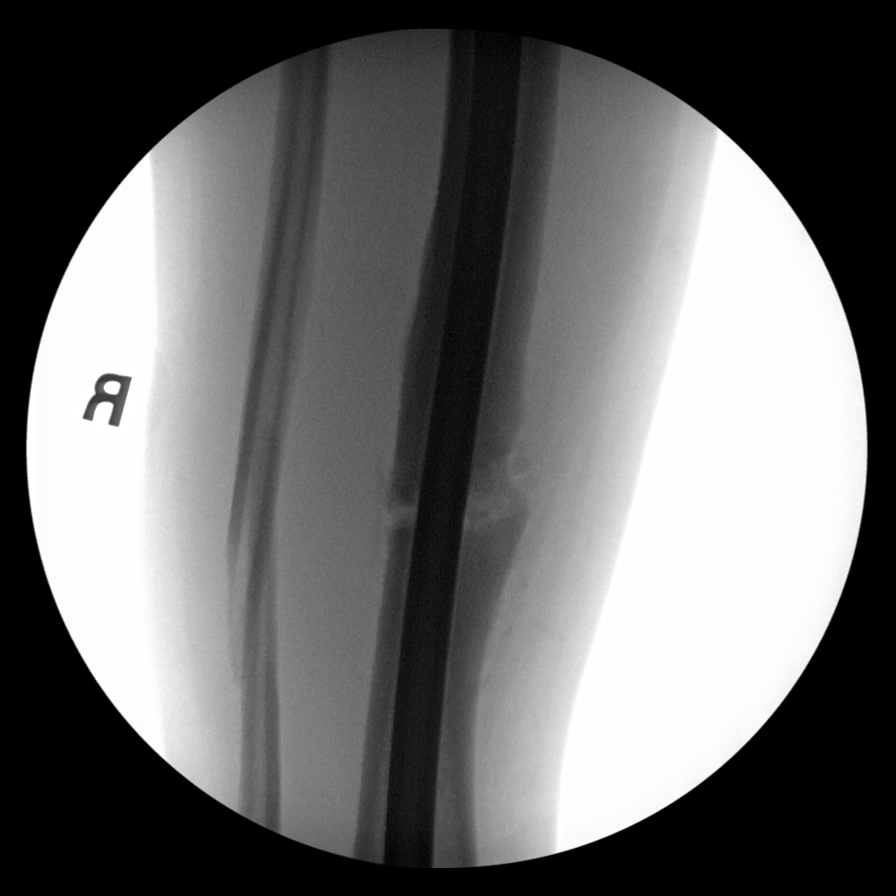
[im 5/7]
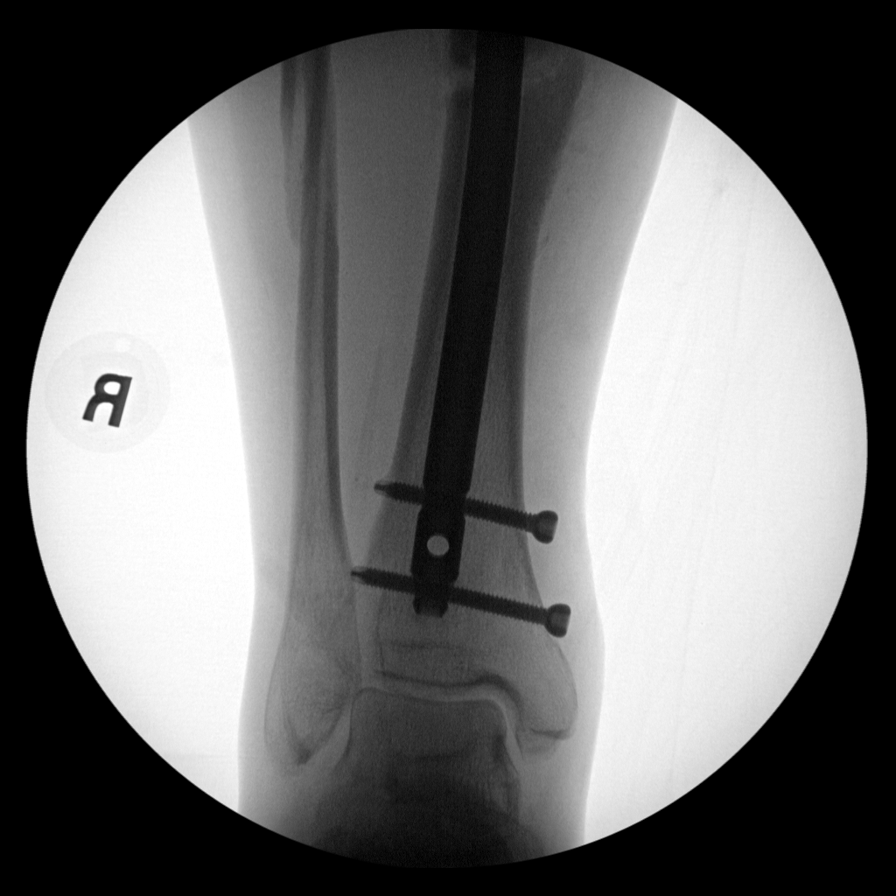
[im 6/7]
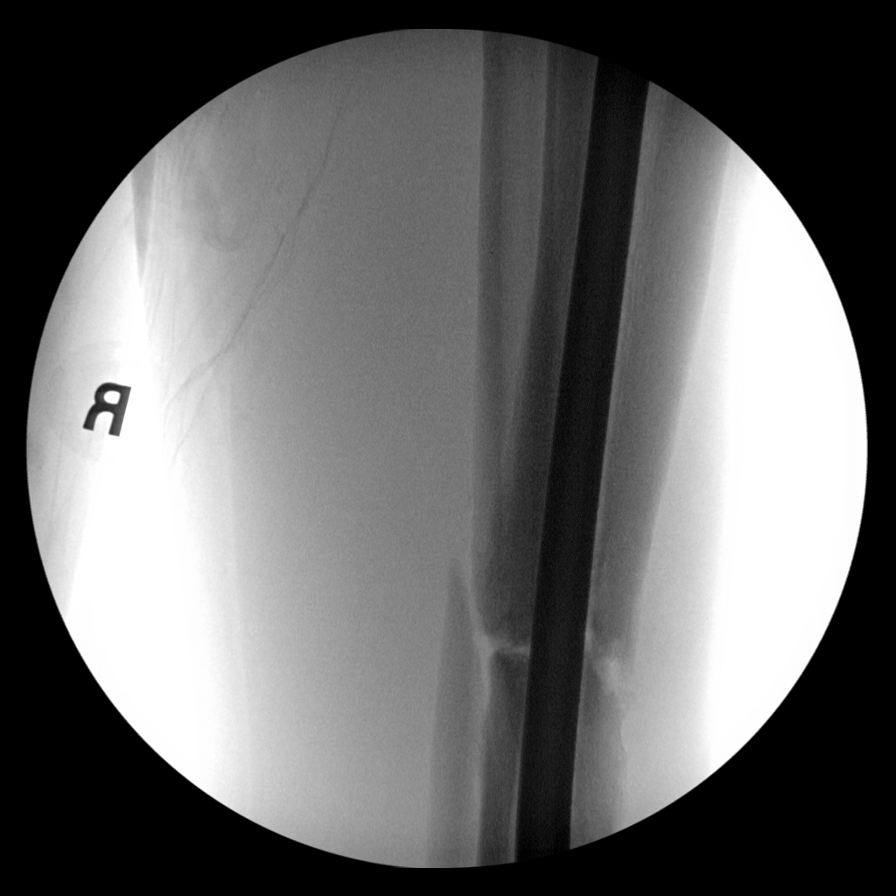
[im 7/7]
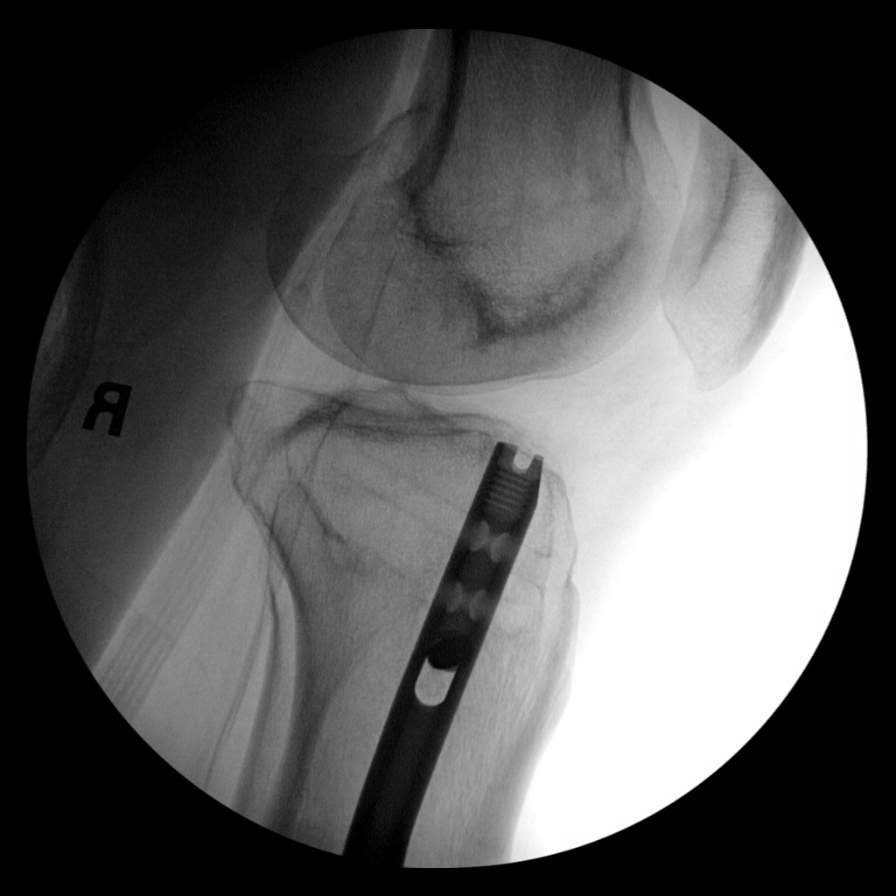

[7 of 7 positions shown; findings below may reference images not displayed]

FINDINGS: Fluoro time: 1 minutes 7 seconds.

Radiation: 4.16 mGy.

Seven C-arm fluoroscopic images were obtained intraoperatively and
submitted for post operative interpretation. These images
demonstrate intramedullary nail fixation of a tibia fracture. Please
see the performing provider's procedural report for further detail.
IMPRESSION: Intraoperative fluoroscopic images, as detailed above.

## 2023-01-19 ENCOUNTER — Other Ambulatory Visit: Payer: Self-pay | Admitting: Nurse Practitioner

## 2023-01-19 ENCOUNTER — Other Ambulatory Visit (HOSPITAL_BASED_OUTPATIENT_CLINIC_OR_DEPARTMENT_OTHER): Payer: Self-pay

## 2023-01-19 DIAGNOSIS — L237 Allergic contact dermatitis due to plants, except food: Secondary | ICD-10-CM

## 2023-01-19 MED ORDER — PREDNISONE 10 MG PO TABS
ORAL_TABLET | ORAL | 0 refills | Status: DC
  Filled 2023-01-19: qty 34, 14d supply, fill #0

## 2023-08-03 ENCOUNTER — Other Ambulatory Visit: Payer: Self-pay | Admitting: Nurse Practitioner

## 2023-08-03 DIAGNOSIS — L237 Allergic contact dermatitis due to plants, except food: Secondary | ICD-10-CM

## 2023-08-03 MED ORDER — CLOBETASOL PROPIONATE 0.05 % EX CREA: 1.0000 | TOPICAL_CREAM | Freq: Two times a day (BID) | CUTANEOUS | 0 refills | Status: AC

## 2023-08-03 MED ORDER — PREDNISONE 10 MG PO TABS: ORAL_TABLET | ORAL | 0 refills | Status: AC

## 2023-10-21 ENCOUNTER — Ambulatory Visit (INDEPENDENT_AMBULATORY_CARE_PROVIDER_SITE_OTHER): Payer: 59 | Admitting: Orthopaedic Surgery

## 2023-10-21 ENCOUNTER — Ambulatory Visit (HOSPITAL_BASED_OUTPATIENT_CLINIC_OR_DEPARTMENT_OTHER): Payer: BC Managed Care – PPO

## 2023-10-21 DIAGNOSIS — M7522 Bicipital tendinitis, left shoulder: Secondary | ICD-10-CM

## 2023-10-21 DIAGNOSIS — M25511 Pain in right shoulder: Secondary | ICD-10-CM

## 2023-10-21 DIAGNOSIS — M25512 Pain in left shoulder: Secondary | ICD-10-CM

## 2023-10-21 DIAGNOSIS — G8929 Other chronic pain: Secondary | ICD-10-CM

## 2023-10-21 DIAGNOSIS — M7521 Bicipital tendinitis, right shoulder: Secondary | ICD-10-CM

## 2023-10-21 MED ORDER — TRIAMCINOLONE ACETONIDE 40 MG/ML IJ SUSP
80.0000 mg | INTRAMUSCULAR | Status: AC | PRN
  Administered 2023-10-21: 80 mg via INTRA_ARTICULAR

## 2023-10-21 MED ORDER — LIDOCAINE HCL 1 % IJ SOLN
4.0000 mL | INTRAMUSCULAR | Status: AC | PRN
  Administered 2023-10-21: 4 mL

## 2023-10-21 NOTE — Progress Notes (Signed)
Chief Complaint: Lateral shoulder pain     History of Present Illness:    George Schooling is a 27 y.o. male presents with bilateral shoulder pain.  He enjoys working out competitively and has been experiencing pain for quite some time in both shoulders.  This has been predominant on the anterior aspect of the shoulder.  He states particular in the left shoulder this is gotten worse and is now bothering him even normal activities.  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  He is experiencing pain in the anterior aspect of the shoulder    PMH/PSH/Family History/Social History/Meds/Allergies:    Past Medical History:  Diagnosis Date  . Closed fracture of proximal phalanx of toe of left foot 02/21/2020  . Open displaced transverse fracture of shaft of right tibia, type III 02/09/2020   Past Surgical History:  Procedure Laterality Date  . APPLICATION OF A-CELL OF EXTREMITY Right 02/17/2020   Procedure: APPLICATION OF A-CELL OF EXTREMITY;  Surgeon: Peggye Form, DO;  Location: [facility] OR;  Service: Plastics;  Laterality: Right;  . APPLICATION OF WOUND VAC Right 02/17/2020   Procedure: APPLICATION OF WOUND VAC;  Surgeon: Peggye Form, DO;  Location: [facility] OR;  Service: Plastics;  Laterality: Right;  . APPLICATION OF WOUND VAC Right 05/14/2020   Procedure: APPLICATION OF WOUND VAC;  Surgeon: Peggye Form, DO;  Location: [facility];  Service: Plastics;  Laterality: Right;  . EXTERNAL FIXATION LEG Right 02/09/2020   Procedure: EXTERNAL FIXATION LEG;  Surgeon: [name], MD;  Location: [facility] OR;  Service: Orthopedics;  Laterality: Right;  . HARDWARE REMOVAL Right 10/18/2020   Procedure: HARDWARE REMOVAL RIGHT TIBIA;  Surgeon: [name], MD;  Location: [facility] OR;  Service: Orthopedics;  Laterality: Right;  . I & D EXTREMITY Right 02/09/2020   Procedure: IRRIGATION AND DEBRIDEMENT EXTREMITY;  Surgeon: [name], MD;  Location: [facility] OR;  Service:  Orthopedics;  Laterality: Right;  . I & D EXTREMITY Right 02/17/2020   Procedure: IRRIGATION AND DEBRIDEMENT RIGHT LOWER LEG;  Surgeon: Peggye Form, DO;  Location: [facility] OR;  Service: Plastics;  Laterality: Right;  . SKIN SPLIT GRAFT Right 05/14/2020   Procedure: SKIN GRAFT SPLIT THICKNESS;  Surgeon: Peggye Form, DO;  Location: [facility];  Service: Plastics;  Laterality: Right;  . TIBIA IM NAIL INSERTION Right 02/13/2020   Procedure: INTRAMEDULLARY (IM) NAIL TIBIAL;  Surgeon: [name], MD;  Location: [facility];  Service: Orthopedics;  Laterality: Right;  removal of external fixator  . TIBIA IM NAIL INSERTION Right 10/18/2020   Procedure: INTRAMEDULLARY (IM) NAIL RIGHT  TIBIAL WITH ILIAC CREST BONE GRAFT;  Surgeon: [name], MD;  Location: [facility] OR;  Service: Orthopedics;  Laterality: Right;   Social History   Socioeconomic History  . Marital status: Married    Spouse name: Not on file  . Number of children: Not on file  . Years of education: Not on file  . Highest education level: Not on file  Occupational History  . Not on file  Tobacco Use  . Smoking status: Never  . Smokeless tobacco: Never  Vaping Use  . Vaping status: Never Used  Substance and Sexual Activity  . Alcohol use: Never  . Drug use: Never  . Sexual activity: Not on file  Other Topics Concern  . Not on file  Social History Narrative  . Not on file   Social Drivers of  Health   Financial Resource Strain: Not on file  Food Insecurity: Not on file  Transportation Needs: Not on file  Physical Activity: Not on file  Stress: Not on file  Social Connections: Not on file   No family history on file. No Known Allergies Current Outpatient Medications  Medication Sig Dispense Refill  . clobetasol cream (TEMOVATE) 0.05 % Apply 1 Application topically 2 (two) times daily. 60 g 0  . influenza vac split quadrivalent PF (FLUARIX) 0.5 ML injection Inject into the muscle. 0.5 mL 0  .  influenza vac split quadrivalent PF (FLUARIX) 0.5 ML injection Inject into the muscle. 0.5 mL 0  . Multiple Vitamins-Minerals (MULTIVITAMIN WITH MINERALS) tablet Take 1 tablet by mouth daily.    . predniSONE (DELTASONE) 10 MG tablet Take 40mg  PO daily x4d, then 30mg  daily x4d, then 20mg  daily x4d, then 10mg  daily x4 d, then 5mg  daily x4d 42 tablet 0   No current facility-administered medications for this visit.   No results found.  Review of Systems:   A ROS was performed including pertinent positives and negatives as documented in the HPI.  Physical Exam :   Constitutional: NAD and appears stated age Neurological: Alert and oriented Psych: Appropriate affect and cooperative There were no vitals taken for this visit.   Comprehensive Musculoskeletal Exam:    Bilateral tenderness about the biceps with positive O'Brien worse in the left than the right.  Full active elevation is to 170 degrees bilaterally.  External rotation at side is to 50 degrees.  Internal rotation is to L1 bilaterally   Imaging:   Xray (3 views right shoulder, 3 views left shoulder): Normal     I personally reviewed and interpreted the radiographs.   Assessment and Plan:   27 y.o. male with evidence of bilateral biceps tendinitis as well as possible labral tearing.  At today's visit I did recommend an ultrasound-guided injection of both biceps tendons.  Will plan to proceed with this.  I have advised that he may send me a message should he not get complete relief from this and we would further discuss additional MRI versus injections.  -Return to clinic as needed     Procedure Note  Patient: Dmarion Vivier             Date of Birth: 06/25/1996           MRN: [id number]             Visit Date: 10/21/2023  Procedures: Visit Diagnoses:  1. Chronic left shoulder pain   2. Chronic right shoulder pain     Large Joint Inj: R glenohumeral on 10/21/2023 5:24 PM Indications: pain Details: 22 G 1.5 in needle,  ultrasound-guided anterior approach  Arthrogram: No  Medications: 4 mL lidocaine 1 %; 80 mg triamcinolone acetonide 40 MG/ML Outcome: tolerated well, no immediate complications Procedure, treatment alternatives, risks and benefits explained, specific risks discussed. Consent was given by the patient. Immediately prior to procedure a time out was called to verify the correct patient, procedure, equipment, support staff and site/side marked as required. Patient was prepped and draped in the usual sterile fashion.    Large Joint Inj: L glenohumeral on 10/21/2023 5:24 PM Indications: pain Details: 22 G 1.5 in needle, ultrasound-guided anterior approach  Arthrogram: No  Medications: 4 mL lidocaine 1 %; 80 mg triamcinolone acetonide 40 MG/ML Outcome: tolerated well, no immediate complications Procedure, treatment alternatives, risks and benefits explained, specific risks discussed. Consent was given by the patient.  Immediately prior to procedure a time out was called to verify the correct patient, procedure, equipment, support staff and site/side marked as required. Patient was prepped and draped in the usual sterile fashion.       I personally saw and evaluated the patient, and participated in the management and treatment plan.  Huel Cote, MD Attending Physician, Orthopedic Surgery  This document was dictated using Dragon voice recognition software. A reasonable attempt at proof reading has been made to minimize errors.

## 2023-11-03 ENCOUNTER — Other Ambulatory Visit (HOSPITAL_BASED_OUTPATIENT_CLINIC_OR_DEPARTMENT_OTHER): Payer: Self-pay

## 2023-11-03 ENCOUNTER — Other Ambulatory Visit: Payer: Self-pay | Admitting: Orthopaedic Surgery

## 2023-11-03 ENCOUNTER — Other Ambulatory Visit (HOSPITAL_BASED_OUTPATIENT_CLINIC_OR_DEPARTMENT_OTHER): Payer: Self-pay | Admitting: Orthopaedic Surgery

## 2023-11-03 DIAGNOSIS — M7521 Bicipital tendinitis, right shoulder: Secondary | ICD-10-CM

## 2023-11-03 MED ORDER — METHYLPREDNISOLONE 4 MG PO TBPK
ORAL_TABLET | ORAL | 0 refills | Status: AC
  Filled 2023-11-03: qty 21, 6d supply, fill #0

## 2024-09-05 ENCOUNTER — Encounter: Payer: Self-pay | Admitting: Radiology
